# Patient Record
Sex: Female | Born: 1950
Health system: Southern US, Community
[De-identification: ages and names within clinical notes are randomized; demographics above are authoritative.]

## PROBLEM LIST (undated history)

## (undated) DIAGNOSIS — I10 Essential (primary) hypertension: Secondary | ICD-10-CM

## (undated) DIAGNOSIS — M8580 Other specified disorders of bone density and structure, unspecified site: Secondary | ICD-10-CM

## (undated) DIAGNOSIS — B029 Zoster without complications: Secondary | ICD-10-CM

## (undated) HISTORY — PX: REDUCTION MAMMAPLASTY: SUR839

## (undated) HISTORY — DX: Essential (primary) hypertension: I10

## (undated) HISTORY — DX: Zoster without complications: B02.9

## (undated) HISTORY — DX: Other specified disorders of bone density and structure, unspecified site: M85.80

## (undated) HISTORY — PX: TONSILLECTOMY AND ADENOIDECTOMY: SHX28

---

## 1997-09-20 HISTORY — PX: BREAST SURGERY: SHX581

## 2010-07-02 ENCOUNTER — Other Ambulatory Visit: Admission: RE | Admit: 2010-07-02 | Discharge: 2010-07-02 | Payer: Self-pay | Admitting: Family Medicine

## 2010-07-09 ENCOUNTER — Encounter: Admission: RE | Admit: 2010-07-09 | Discharge: 2010-07-09 | Payer: Self-pay | Admitting: Family Medicine

## 2010-07-22 ENCOUNTER — Encounter: Admission: RE | Admit: 2010-07-22 | Discharge: 2010-07-22 | Payer: Self-pay | Admitting: Family Medicine

## 2010-10-10 ENCOUNTER — Encounter: Payer: Self-pay | Admitting: Family Medicine

## 2011-09-17 ENCOUNTER — Encounter: Payer: Self-pay | Admitting: Gynecology

## 2011-09-17 ENCOUNTER — Other Ambulatory Visit (HOSPITAL_COMMUNITY)
Admission: RE | Admit: 2011-09-17 | Discharge: 2011-09-17 | Disposition: A | Discharge status: Home / Self Care | Payer: BC Managed Care – PPO | Source: Ambulatory Visit | Attending: Gynecology | Admitting: Gynecology

## 2011-09-17 ENCOUNTER — Ambulatory Visit (INDEPENDENT_AMBULATORY_CARE_PROVIDER_SITE_OTHER): Payer: BC Managed Care – PPO | Admitting: Gynecology

## 2011-09-17 VITALS — BP 150/90 | Ht 64.25 in | Wt 169.0 lb

## 2011-09-17 DIAGNOSIS — Z01419 Encounter for gynecological examination (general) (routine) without abnormal findings: Secondary | ICD-10-CM | POA: Insufficient documentation

## 2011-09-17 DIAGNOSIS — N8111 Cystocele, midline: Secondary | ICD-10-CM

## 2011-09-17 DIAGNOSIS — N950 Postmenopausal bleeding: Secondary | ICD-10-CM

## 2011-09-17 DIAGNOSIS — N816 Rectocele: Secondary | ICD-10-CM

## 2011-09-17 DIAGNOSIS — N814 Uterovaginal prolapse, unspecified: Secondary | ICD-10-CM

## 2011-09-17 NOTE — Progress Notes (Signed)
Sharon Burton 09/02/1951 [id number]        60 y.o.  New patient for annual exam and complaining of postmenopausal spotting. Patient notes her last 2 days spotting with wiping. She is having no abdominal pain cramping or other symptoms.    Past medical history,surgical history, medications, allergies, family history and social history were all reviewed and documented in the EPIC chart. ROS:  Was performed and pertinent positives and negatives are included in the history.  Exam: chaperone present Filed Vitals:   09/17/11 0905  BP: 150/90   General appearance  Normal Skin grossly normal Head/Neck normal with no cervical or supraclavicular adenopathy thyroid normal Lungs  clear Cardiac RR, without RMG Abdominal  soft, nontender, without masses, organomegaly or hernia Breasts  examined lying and sitting without masses, retractions, discharge or axillary adenopathy.  Well-healed old bilateral reduction scars noted Pelvic  Ext/BUS/vagina  With mild atrophic changes. Second degree cystocele with straining first degree uterine prolapse first to second-degree rectocele.  Cervix  normal  Pap done  Uterus  anteverted, normal size, shape and contour, midline and mobile nontender.  Mild uterine descent with straining.  Adnexa  Without masses or tenderness    Anus and perineum  normal   Rectovaginal  normal sphincter tone without palpated masses or tenderness. First to second degree rectocele noted with exam.  Old external hemorrhoids.   Assessment/Plan:  60 y.o. female for annual exam.    1. Postmenopausal bleeding. Reviewed differential with the patient to include endometrial carcinoma. We'll start with sonohysterogram she will schedule a follow up for this. 2. Cystocele/rectocele/uterine prolapse. Patient does note being told this in the past. If she is straining a lot she does feel something protruding from her vagina. This is not overly bothersome to her. She has no stress urinary incontinence  type symptoms. Options for management include observation, pessary trial, surgery was reviewed and the patient would prefer observation at this time. 3. High blood pressure.  Patient's blood pressure is 150/90. She has no history of hypertension in the past. Recommended she recheck this in a non-exam situation. She does have a blood pressure cuff at home as her husband is being followed for hypertension and she'll recheck it there. If it does continue elevated the patient is to follow up with her primary for further evaluation and possible treatment. 4. Mammogram. She's over a year from her last mammogram. Recommend she schedule this now and she agrees to do so. SBE monthly reviewed.  5. Pap smear. She has no history of abnormal Pap smears in the past although I have no copies from her prior records. I did a Pap smear today. I discussed less frequent screening options and if this returns normal that we will do every 3 year screens noting that over the last 5 years or so historically she has had annual normal Pap smears. 6. Bone health. Recommended increased calcium vitamin D. We'll plan DEXA next year at age 61. 7. Colonoscopy. Patient has never had a colonoscopy and I strongly recommended her to schedule a screening colonoscopy and she agrees to arrange this. 8. Health maintenance. No blood work was done today as it is all done through her primary's office who she actively sees. Patient will follow up for her sonohysterogram and then we'll go from there.     Dara Lords MD, 9:44 AM 09/17/2011

## 2011-09-17 NOTE — Patient Instructions (Signed)
Follow for sonohysterogram as scheduled. Recheck blood pressure. If it remains elevated then you'll need to see her primary physician for further evaluation.  Schedule mammography and colonoscopy.

## 2011-09-29 ENCOUNTER — Other Ambulatory Visit: Payer: Self-pay | Admitting: Gynecology

## 2011-09-29 ENCOUNTER — Ambulatory Visit (INDEPENDENT_AMBULATORY_CARE_PROVIDER_SITE_OTHER): Payer: BC Managed Care – PPO

## 2011-09-29 ENCOUNTER — Ambulatory Visit (INDEPENDENT_AMBULATORY_CARE_PROVIDER_SITE_OTHER): Payer: BC Managed Care – PPO | Admitting: Gynecology

## 2011-09-29 ENCOUNTER — Encounter: Payer: Self-pay | Admitting: Gynecology

## 2011-09-29 DIAGNOSIS — N83339 Acquired atrophy of ovary and fallopian tube, unspecified side: Secondary | ICD-10-CM

## 2011-09-29 DIAGNOSIS — N854 Malposition of uterus: Secondary | ICD-10-CM

## 2011-09-29 DIAGNOSIS — N950 Postmenopausal bleeding: Secondary | ICD-10-CM

## 2011-09-29 NOTE — Patient Instructions (Signed)
Follow up for biopsy results. Report any further vaginal bleeding in the future.

## 2011-09-29 NOTE — Progress Notes (Signed)
Patient presents for sonohysterogram do to postmenopausal spotting.  Ultrasound initially shows endometrial echo at 2.6 mm. No uterine abnormalities noted. Right and left ovaries normal. Negative cul-de-sac. Sonohysterogram was performed, sterile technique, anterior lip tenaculum placement necessary to thread the catheter. There was good distention with no abnormalities seen. An endometrial sample was taken. The patient tolerated well.  Discussed ultrasound results with the patient. If biopsy returns benign or in adequate them will follow expectantly. If bleeding continues will report, if resolves then will follow. If biopsy otherwise then we will triage based on results.

## 2012-10-20 ENCOUNTER — Ambulatory Visit (INDEPENDENT_AMBULATORY_CARE_PROVIDER_SITE_OTHER): Payer: BC Managed Care – PPO | Admitting: Gynecology

## 2012-10-20 ENCOUNTER — Encounter: Payer: Self-pay | Admitting: Gynecology

## 2012-10-20 VITALS — BP 124/80 | Ht 65.0 in | Wt 172.0 lb

## 2012-10-20 DIAGNOSIS — Z780 Asymptomatic menopausal state: Secondary | ICD-10-CM

## 2012-10-20 DIAGNOSIS — Z13220 Encounter for screening for lipoid disorders: Secondary | ICD-10-CM

## 2012-10-20 DIAGNOSIS — Z01419 Encounter for gynecological examination (general) (routine) without abnormal findings: Secondary | ICD-10-CM

## 2012-10-20 DIAGNOSIS — N814 Uterovaginal prolapse, unspecified: Secondary | ICD-10-CM

## 2012-10-20 LAB — CBC WITH DIFFERENTIAL/PLATELET
Basophils Absolute: 0 10*3/uL (ref 0.0–0.1)
Basophils Relative: 0 % (ref 0–1)
Eosinophils Absolute: 0.2 10*3/uL (ref 0.0–0.7)
Eosinophils Relative: 3 % (ref 0–5)
HCT: 38.2 % (ref 36.0–46.0)
Hemoglobin: 12.9 g/dL (ref 12.0–15.0)
Lymphocytes Relative: 30 % (ref 12–46)
Lymphs Abs: 1.4 10*3/uL (ref 0.7–4.0)
MCH: 30.4 pg (ref 26.0–34.0)
MCHC: 33.8 g/dL (ref 30.0–36.0)
MCV: 89.9 fL (ref 78.0–100.0)
Monocytes Absolute: 0.4 10*3/uL (ref 0.1–1.0)
Monocytes Relative: 8 % (ref 3–12)
Neutro Abs: 2.7 10*3/uL (ref 1.7–7.7)
Neutrophils Relative %: 59 % (ref 43–77)
Platelets: 334 10*3/uL (ref 150–400)
RBC: 4.25 MIL/uL (ref 3.87–5.11)
RDW: 13 % (ref 11.5–15.5)
WBC: 4.6 10*3/uL (ref 4.0–10.5)

## 2012-10-20 LAB — LIPID PANEL
Cholesterol: 195 mg/dL (ref 0–200)
HDL: 64 mg/dL (ref >39)
LDL Cholesterol: 112 mg/dL — ABNORMAL HIGH (ref 0–99)
Total CHOL/HDL Ratio: 3 Ratio
Triglycerides: 97 mg/dL (ref <150)
VLDL: 19 mg/dL (ref 0–40)

## 2012-10-20 LAB — COMPREHENSIVE METABOLIC PANEL
ALT: 18 U/L (ref 0–35)
AST: 15 U/L (ref 0–37)
Albumin: 4.4 g/dL (ref 3.5–5.2)
Alkaline Phosphatase: 109 U/L (ref 39–117)
BUN: 14 mg/dL (ref 6–23)
CO2: 26 mEq/L (ref 19–32)
Calcium: 9.4 mg/dL (ref 8.4–10.5)
Chloride: 103 mEq/L (ref 96–112)
Creat: 0.71 mg/dL (ref 0.50–1.10)
Glucose, Bld: 100 mg/dL — ABNORMAL HIGH (ref 70–99)
Potassium: 4.3 mEq/L (ref 3.5–5.3)
Sodium: 143 mEq/L (ref 135–145)
Total Bilirubin: 0.4 mg/dL (ref 0.3–1.2)
Total Protein: 7.1 g/dL (ref 6.0–8.3)

## 2012-10-20 LAB — TSH: TSH: 2.305 u[IU]/mL (ref 0.350–4.500)

## 2012-10-20 NOTE — Patient Instructions (Addendum)
Office will call you to schedule surgery  Schedule your colonoscopy.  [facility]  Call to Schedule your mammogram  Facilities in [place]: 1)  [facility] [place], [place] Phone: [phone] 2)  [facility], [phone] Phone: [phone] 3)  Dr. Yolanda Bonine at [facility] [address] Phone: [phone]     Mammogram A mammogram is an X-ray test to find changes in a woman's breast. You should get a mammogram if:  You are 18 years of age or older  You have risk factors.   Your doctor recommends that you have one.  BEFORE THE TEST  Do not schedule the test the week before your period, especially if your breasts are sore during this time.  On the day of your mammogram:  Wash your breasts and armpits well. After washing, do not put on any deodorant or talcum powder on until after your test.   Eat and drink as you usually do.   Take your medicines as usual.   If you are diabetic and take insulin, make sure you:   Eat before coming for your test.   Take your insulin as usual.   If you cannot keep your appointment, call before the appointment to cancel. Schedule another appointment.  TEST  You will need to undress from the waist up. You will put on a hospital gown.   Your breast will be put on the mammogram machine, and it will press firmly on your breast with a piece of plastic called a compression paddle. This will make your breast flatter so that the machine can X-ray all parts of your breast.   Both breasts will be X-rayed. Each breast will be X-rayed from above and from the side. An X-ray might need to be taken again if the picture is not good enough.   The mammogram will last about 15 to 30 minutes.  AFTER THE TEST Finding out the results of your test Ask when your test results will be ready. Make sure you get your test  results.  Document Released: 12/03/2008 Document Revised: 08/26/2011 Document Reviewed: 12/03/2008 [facility] Patient Information 2012 [place], [place].

## 2012-10-20 NOTE — Progress Notes (Signed)
Sharon Burton 05-Oct-1950 [id number]        62 y.o.  [id number] for annual exam.  Several issues noted below.  Past medical history,surgical history, medications, allergies, family history and social history were all reviewed and documented in the EPIC chart. ROS:  Was performed and pertinent positives and negatives are included in the history.  Exam: Kim assistant Filed Vitals:   10/20/12 0956  BP: 124/80  Height: 5\' 5"  (1.651 m)  Weight: 172 lb (78.019 kg)   General appearance  Normal Skin grossly normal Head/Neck normal with no cervical or supraclavicular adenopathy thyroid normal Lungs  clear Cardiac RR, without RMG Abdominal  soft, nontender, without masses, organomegaly or hernia Breasts  examined lying and sitting without masses, retractions, discharge or axillary adenopathy.  Well healed bilateral reduction scars. Pelvic  Ext/BUS/vagina  normal with atrophic changes.  Cervix  normal with descent to the vaginal opening  Uterus  anteverted, normal size, shape and contour, midline and mobile nontender   Adnexa  Without masses or tenderness    Anus and perineum  normal   Rectovaginal  normal sphincter tone without palpated masses or tenderness.    Assessment/Plan:  62 y.o. [id number] female for annual exam.   1. POP with uterine prolapse. Grade 2. Bladder appears well supported without gross evidence of cystocele. Some rectal vaginal wall weakness with no overt rectocele with straining. Reviewed options with the patient as I did last year to include observation pessary and surgery. Patient is noting more symptoms where every time she is on the toilet and straining the cervix is felt protruding. Also a lot of pressure symptoms. Occasional SUI type symptoms but not overly bothersome. Patient wants to proceed with surgery and I recommend a vaginal hysterectomy. Do not feel anterior/posterior colporrhaphy needed at this time although I did review with her that this may become needed in the  future and the issue as to whether to do now "prophylactically" discussed. Patient is not interested she understands the need for potential future surgery. She also understands the possible need for bladder surgery in the future if incontinence becomes an issue at this point she appears to be well supported and has no overt issues other than the uterine prolapse. The ovarian conservation issue was reviewed with her the options to remove both ovaries at the time of surgery or leave them discussed. At 61 the question is how much more hormonal benefit she this to riding versus the risks of ovarian disease in the future. She has no strong family history of ovarian cancer. After a lengthy discussion she wants both ovaries removed but if this would require a separate incision either laparoscopically or larger she would not want this if the ovaries appeared normal and accepts the risk of ovarian disease in the future such as ovarian cancer.  We will move toward scheduling the surgery and will represent for full preoperative consult. 2. Pap smear 2012. No Pap smear done today. No history of significant abnormalities in the past. Less frequent screening options reviewed. If she does proceed with hysterectomy the issue about stop screening altogether was also discussed. We'll readdress this next year on an annual basis. 3. Mammography 2011. Strongly recommended she schedule mammogram now. The benefits of her early detection were reviewed. SBE monthly discussed. 4. DEXA. Patient's never had a baseline DEXA and I recommend she schedule one now. Increase calcium vitamin D discussed. 5. Colonoscopy. Patient's colonoscopy I strongly recommended she schedule this and she agrees to do so.  The benefits of early detection and removing premalignant polyps were reviewed.  Names of physicians in [place] were given. 6. Health maintenance. Patient has not had routine blood work done in over a year. Baseline CBC lipid profile  comprehensive metabolic panel vitamin D TSH urinalysis ordered.   Dara Lords MD, 10:38 AM 10/20/2012

## 2012-10-21 LAB — URINALYSIS W MICROSCOPIC + REFLEX CULTURE
Bilirubin Urine: NEGATIVE
Casts: NONE SEEN
Crystals: NONE SEEN
Glucose, UA: NEGATIVE mg/dL
Hgb urine dipstick: NEGATIVE
Ketones, ur: NEGATIVE mg/dL
Leukocytes, UA: NEGATIVE
Nitrite: NEGATIVE
Protein, ur: NEGATIVE mg/dL
Specific Gravity, Urine: 1.016 (ref 1.005–1.030)
Urobilinogen, UA: 0.2 mg/dL (ref 0.0–1.0)
pH: 7 (ref 5.0–8.0)

## 2012-10-21 LAB — VITAMIN D 25 HYDROXY (VIT D DEFICIENCY, FRACTURES): Vit D, 25-Hydroxy: 34 ng/mL (ref 30–89)

## 2012-10-22 LAB — URINE CULTURE: Colony Count: 2000

## 2012-10-24 ENCOUNTER — Telehealth: Payer: Self-pay

## 2012-10-24 NOTE — Telephone Encounter (Signed)
I left message for patient to call me to discuss ins benefits and scheduling surgery.

## 2012-10-24 NOTE — Telephone Encounter (Signed)
Patient called. We discussed ins benefits and her surgery pre-payment amount.  She wants to schedule her TVH,BSO for the end of April hopefully the 28th or 29th.  I told her I will hold on to her chart and as soon as I get the April schedule I will call her and let her know if that works out.

## 2012-11-28 ENCOUNTER — Ambulatory Visit (INDEPENDENT_AMBULATORY_CARE_PROVIDER_SITE_OTHER): Payer: BC Managed Care – PPO

## 2012-11-28 DIAGNOSIS — Z13820 Encounter for screening for osteoporosis: Secondary | ICD-10-CM

## 2012-11-28 DIAGNOSIS — Z780 Asymptomatic menopausal state: Secondary | ICD-10-CM

## 2012-11-30 ENCOUNTER — Other Ambulatory Visit: Payer: Self-pay

## 2012-11-30 DIAGNOSIS — Z1231 Encounter for screening mammogram for malignant neoplasm of breast: Secondary | ICD-10-CM

## 2012-12-06 ENCOUNTER — Ambulatory Visit
Admission: RE | Admit: 2012-12-06 | Discharge: 2012-12-06 | Disposition: A | Discharge status: Home / Self Care | Payer: BC Managed Care – PPO | Source: Ambulatory Visit

## 2012-12-06 DIAGNOSIS — Z1231 Encounter for screening mammogram for malignant neoplasm of breast: Secondary | ICD-10-CM

## 2012-12-25 ENCOUNTER — Encounter (HOSPITAL_COMMUNITY): Payer: Self-pay

## 2012-12-25 ENCOUNTER — Encounter: Payer: Self-pay | Admitting: Gynecology

## 2012-12-25 ENCOUNTER — Ambulatory Visit (INDEPENDENT_AMBULATORY_CARE_PROVIDER_SITE_OTHER): Payer: BC Managed Care – PPO | Admitting: Gynecology

## 2012-12-25 DIAGNOSIS — N814 Uterovaginal prolapse, unspecified: Secondary | ICD-10-CM

## 2012-12-25 DIAGNOSIS — N816 Rectocele: Secondary | ICD-10-CM

## 2012-12-25 NOTE — Patient Instructions (Addendum)
Followup for surgery as scheduled. 

## 2012-12-25 NOTE — Progress Notes (Signed)
Sharon Burton 21-Aug-1951 [id number]   Preoperative consult  Chief complaint: Uterine prolapse  History of present illness: 62 y.o. [id number] progressive uterine prolapse with symptoms of pressure and feeling something protruding from the vagina. Has progressively gotten worse over the last several years. Options for management include observation, pessary, surgery discussed and she wished to proceed with surgery and is admitted for [facility] BSO.  Past medical history,surgical history, medications, allergies, family history and social history were all reviewed and documented in the EPIC chart. ROS:  Was performed and pertinent positives and negatives are included in the history of present illness.  Exam:  Kim assistant General: well developed, well nourished female, no acute distress HEENT: normal  Lungs: clear to auscultation without wheezing, rales or rhonchi  Cardiac: regular rate without rubs, murmurs or gallops  Abdomen: soft, nontender without masses, guarding, rebound, organomegaly  Pelvic: external bus vagina: normal  without significant cystocele. First to second degree rectocele on exam. Cervix to the level of the introitus with straining Cervix: grossly normal  Uterus: normal size, midline and mobile, nontender  Adnexa: without masses or tenderness  Rectovaginal exam within normal limits. Confirms mild rectocele.    Assessment/Plan:  62 y.o. [id number] with progressive uterine prolapse. Does not appear to have significant cystocele. Does have mild rectocele. Reviewed with the patient as in the past and I again reviewed today that it appears her main issue is uterine prolapse. The question is to whether we also perform a posterior colporrhaphy or an anterior/posterior colporrhaphy at this time versus waiting and see if she develops issues with cystocele rectocele in the future. We both agreed on vaginal hysterectomy only recognizing that she may need to proceed with anterior/posterior  colporrhaphy in the future and excepting this possibility. We also reviewed the ovarian conservation issue again as in the past. Options of keeping both ovaries or removing them discussed. She does not have a strong family history of ovarian cancer but does want to have both ovaries removed if we are able to accomplish this vaginally. If it would require a separate incision abdominally then she does not want this done and she will keep 1 or both ovaries understanding that she also would then be at risk for ovarian cancer and she understands and accepts this. The possibility although low at age 62 that she would develop significant hypoestrogenic symptoms by removing her ovaries was also discussed. The expected intraoperative postoperative courses as well as the recovery period was reviewed. The risks of infection, prolonged antibiotics, abscess or hematoma development requiring reoperation and drainage were reviewed. The risk of hemorrhage necessitating transfusion and the risk of transfusion discussed to include transfusion reaction hepatitis HIV mad cow disease and other unknown entities. The risk of internal organ damage including bowel bladder ureters vessels and nerves either immediately recognized or delay recognized requiring major reparative surgeries and future reparative surgeries including bowel resection and bladder repair ureteral damage repair ostomy formation was all discussed understood and accepted. The possibility of converting a vaginal approach to a abdominal approach with a larger incision and a longer recovery period was also reviewed. If abdominal incisions are made the risk of incisional complications including opening and draining of incisions, closure by secondary intention, cosmetic/keloid reviewed. Sexuality following hysterectomy was discussed to include the potential for orgasmic dysfunction as well as persistent dyspareunia. Patient's questions were answered to her satisfaction and she  is ready to proceed with surgery.    Dara Lords MD, 9:49 AM 12/25/2012

## 2012-12-25 NOTE — H&P (Signed)
Sharon Burton March 23, 1951 [id number]   History and Physical  Chief complaint: Uterine prolapse  History of present illness: 62 y.o. [id number] progressive uterine prolapse with symptoms of pressure and feeling something protruding from the vagina. Has progressively gotten worse over the last several years. Options for management include observation, pessary, surgery discussed and she wished to proceed with surgery and is admitted for [facility] BSO.  Past medical history,surgical history, medications, allergies, family history and social history were all reviewed and documented in the EPIC chart. ROS:  Was performed and pertinent positives and negatives are included in the history of present illness.  Exam:  Sharon Burton assistant General: well developed, well nourished female, no acute distress HEENT: normal  Lungs: clear to auscultation without wheezing, rales or rhonchi  Cardiac: regular rate without rubs, murmurs or gallops  Abdomen: soft, nontender without masses, guarding, rebound, organomegaly  Pelvic: external bus vagina: normal  without significant cystocele. First to second degree rectocele on exam. Cervix to the level of the introitus with straining Cervix: grossly normal  Uterus: normal size, midline and mobile, nontender  Adnexa: without masses or tenderness  Rectovaginal exam within normal limits. Confirms mild rectocele.    Assessment/Plan:  62 y.o. Sharon Burton with progressive uterine prolapse. Does not appear to have significant cystocele. Does have mild rectocele. Reviewed with the patient as in the past and I again reviewed today that it appears her main issue is uterine prolapse. The question is to whether we also perform a posterior colporrhaphy or an anterior/posterior colporrhaphy at this time versus waiting and see if she develops issues with cystocele rectocele in the future. We both agreed on vaginal hysterectomy only recognizing that she may need to proceed with anterior/posterior  colporrhaphy in the future and excepting this possibility. We also reviewed the ovarian conservation issue again as in the past. Options of keeping both ovaries or removing them discussed. She does not have a strong family history of ovarian cancer but does want to have both ovaries removed if we are able to accomplish this vaginally. If it would require a separate incision abdominally then she does not want this done and she will keep 1 or both ovaries understanding that she also would then be at risk for ovarian cancer and she understands and accepts this. The possibility although low at age 29 that she would develop significant hypoestrogenic symptoms by removing her ovaries was also discussed. The expected intraoperative postoperative courses as well as the recovery period was reviewed. The risks of infection, prolonged antibiotics, abscess or hematoma development requiring reoperation and drainage were reviewed. The risk of hemorrhage necessitating transfusion and the risk of transfusion discussed to include transfusion reaction hepatitis HIV mad cow disease and other unknown entities. The risk of internal organ damage including bowel bladder ureters vessels and nerves either immediately recognized or delay recognized requiring major reparative surgeries and future reparative surgeries including bowel resection and bladder repair ureteral damage repair ostomy formation was all discussed understood and accepted. The possibility of converting a vaginal approach to a abdominal approach with a larger incision and a longer recovery period was also reviewed. If abdominal incisions are made the risk of incisional complications including opening and draining of incisions, closure by secondary intention, cosmetic/keloid reviewed. Sexuality following hysterectomy was discussed to include the potential for orgasmic dysfunction as well as persistent dyspareunia. Patient's questions were answered to her satisfaction and she  is ready to proceed with surgery.    Dara Lords MD, 10:00 AM 12/25/2012

## 2012-12-26 ENCOUNTER — Encounter (HOSPITAL_COMMUNITY)
Admission: RE | Admit: 2012-12-26 | Discharge: 2012-12-26 | Disposition: A | Discharge status: Home / Self Care | Payer: BC Managed Care – PPO | Source: Ambulatory Visit | Attending: Gynecology | Admitting: Gynecology

## 2012-12-26 ENCOUNTER — Telehealth: Payer: Self-pay

## 2012-12-26 ENCOUNTER — Encounter (HOSPITAL_COMMUNITY): Payer: Self-pay

## 2012-12-26 LAB — CBC
HCT: 39 % (ref 36.0–46.0)
Hemoglobin: 12.9 g/dL (ref 12.0–15.0)
MCH: 30.1 pg (ref 26.0–34.0)
MCHC: 33.1 g/dL (ref 30.0–36.0)
MCV: 91.1 fL (ref 78.0–100.0)
Platelets: 281 10*3/uL (ref 150–400)
RBC: 4.28 MIL/uL (ref 3.87–5.11)
RDW: 13 % (ref 11.5–15.5)
WBC: 5.3 10*3/uL (ref 4.0–10.5)

## 2012-12-26 LAB — COMPREHENSIVE METABOLIC PANEL
ALT: 24 U/L (ref 0–35)
Albumin: 3.8 g/dL (ref 3.5–5.2)
Alkaline Phosphatase: 116 U/L (ref 39–117)
BUN: 11 mg/dL (ref 6–23)
Calcium: 9.6 mg/dL (ref 8.4–10.5)
Chloride: 105 mEq/L (ref 96–112)
Creatinine, Ser: 0.71 mg/dL (ref 0.50–1.10)
GFR calc Af Amer: >90 mL/min (ref >90)
GFR calc non Af Amer: >90 mL/min (ref >90)
Glucose, Bld: 112 mg/dL — ABNORMAL HIGH (ref 70–99)
Potassium: 5.2 mEq/L — ABNORMAL HIGH (ref 3.5–5.1)
Sodium: 140 mEq/L (ref 135–145)
Total Bilirubin: 0.4 mg/dL (ref 0.3–1.2)
Total Protein: 7.3 g/dL (ref 6.0–8.3)

## 2012-12-26 LAB — SURGICAL PCR SCREEN
MRSA, PCR: INVALID — AB
Staphylococcus aureus: INVALID — AB

## 2012-12-26 NOTE — Telephone Encounter (Signed)
Patient called. Informed regarding potassium level being a "touch high".  She is not taking any supplemental potassium or any vitamin supp that might have it in it.  She will be careful to avoid high potassium foods between now and surgery date.

## 2012-12-26 NOTE — Pre-Procedure Instructions (Signed)
EKG of today (12/26/12) reviewed and accepted by Dr Arby Barrette. No orders given.

## 2012-12-26 NOTE — Telephone Encounter (Signed)
Message copied by Keenan Bachelor on Tue Dec 26, 2012 10:09 AM ------      Message from: Dara Lords      Created: Tue Dec 26, 2012  9:51 AM       Tell patient that her potassium level was a touch high. If she is taking any supplemental potassium or foods with higher potassium stop this preoperatively. ------

## 2012-12-26 NOTE — Patient Instructions (Addendum)
20 Sharon Burton  12/26/2012   Your procedure is scheduled on:  01/01/13  Enter through the Main Entrance of [facility] at 6 AM.  Pick up the phone at the desk and dial [phone].   Call this number if you have problems the morning of surgery: [phone]   Remember:   Do not eat food:After Midnight.  Do not drink clear liquids: After Midnight.  Take these medicines the morning of surgery with A SIP OF WATER: NA   Do not wear jewelry, make-up or nail polish.  Do not wear lotions, powders, or perfumes. You may wear deodorant.  Do not shave 48 hours prior to surgery.  Do not bring valuables to the hospital.  Contacts, dentures or bridgework may not be worn into surgery.  Leave suitcase in the car. After surgery it may be brought to your room.  For patients admitted to the hospital, checkout time is 11:00 AM the day of discharge.   Patients discharged the day of surgery will not be allowed to drive home.  Name and phone number of your driver: NA  Special Instructions: Shower using CHG 2 nights before surgery and the night before surgery.  If you shower the day of surgery use CHG.  Use special wash - you have one bottle of CHG for all showers.  You should use approximately 1/3 of the bottle for each shower.   Please read over the following fact sheets that you were given: MRSA Information

## 2012-12-26 NOTE — Telephone Encounter (Signed)
Left message to call regarding lab result/Dr. TF recommendation.

## 2012-12-28 LAB — MRSA CULTURE

## 2012-12-31 MED ORDER — DEXTROSE 5 % IV SOLN
2.0000 g | INTRAVENOUS | Status: AC
  Administered 2013-01-01: 2 g via INTRAVENOUS
  Filled 2012-12-31: qty 2

## 2013-01-01 ENCOUNTER — Encounter (HOSPITAL_COMMUNITY)
Admission: RE | Disposition: A | Discharge status: Home / Self Care | Payer: Self-pay | Source: Ambulatory Visit | Attending: Gynecology

## 2013-01-01 ENCOUNTER — Ambulatory Visit (HOSPITAL_COMMUNITY)
Admission: RE | Admit: 2013-01-01 | Discharge: 2013-01-02 | Disposition: A | Discharge status: Home / Self Care | Payer: BC Managed Care – PPO | Source: Ambulatory Visit | Attending: Gynecology | Admitting: Gynecology

## 2013-01-01 ENCOUNTER — Encounter (HOSPITAL_COMMUNITY): Payer: Self-pay | Admitting: Anesthesiology

## 2013-01-01 ENCOUNTER — Encounter (HOSPITAL_COMMUNITY): Payer: Self-pay

## 2013-01-01 ENCOUNTER — Ambulatory Visit (HOSPITAL_COMMUNITY): Payer: BC Managed Care – PPO | Admitting: Anesthesiology

## 2013-01-01 DIAGNOSIS — Z9889 Other specified postprocedural states: Secondary | ICD-10-CM

## 2013-01-01 DIAGNOSIS — N816 Rectocele: Secondary | ICD-10-CM

## 2013-01-01 DIAGNOSIS — N812 Incomplete uterovaginal prolapse: Secondary | ICD-10-CM | POA: Insufficient documentation

## 2013-01-01 DIAGNOSIS — N880 Leukoplakia of cervix uteri: Secondary | ICD-10-CM | POA: Insufficient documentation

## 2013-01-01 DIAGNOSIS — N814 Uterovaginal prolapse, unspecified: Secondary | ICD-10-CM

## 2013-01-01 DIAGNOSIS — N838 Other noninflammatory disorders of ovary, fallopian tube and broad ligament: Secondary | ICD-10-CM | POA: Insufficient documentation

## 2013-01-01 HISTORY — PX: VAGINAL HYSTERECTOMY: SHX2639

## 2013-01-01 HISTORY — PX: CYSTOCELE REPAIR: SHX163

## 2013-01-01 SURGERY — HYSTERECTOMY, VAGINAL
Anesthesia: General | Wound class: Clean Contaminated

## 2013-01-01 MED ORDER — ONDANSETRON HCL 4 MG/2ML IJ SOLN
INTRAMUSCULAR | Status: AC
  Filled 2013-01-01: qty 2

## 2013-01-01 MED ORDER — ONDANSETRON HCL 4 MG/2ML IJ SOLN
INTRAMUSCULAR | Status: DC | PRN
  Administered 2013-01-01: 4 mg via INTRAVENOUS

## 2013-01-01 MED ORDER — FENTANYL CITRATE 0.05 MG/ML IJ SOLN
INTRAMUSCULAR | Status: AC
  Filled 2013-01-01: qty 5

## 2013-01-01 MED ORDER — NEOSTIGMINE METHYLSULFATE 1 MG/ML IJ SOLN
INTRAMUSCULAR | Status: AC
  Filled 2013-01-01: qty 1

## 2013-01-01 MED ORDER — DEXTROSE-NACL 5-0.9 % IV SOLN: INTRAVENOUS | Status: DC

## 2013-01-01 MED ORDER — LIDOCAINE HCL (CARDIAC) 20 MG/ML IV SOLN
INTRAVENOUS | Status: AC
  Filled 2013-01-01: qty 5

## 2013-01-01 MED ORDER — FENTANYL CITRATE 0.05 MG/ML IJ SOLN
INTRAMUSCULAR | Status: DC | PRN
  Administered 2013-01-01: 100 ug via INTRAVENOUS
  Administered 2013-01-01 (×2): 50 ug via INTRAVENOUS

## 2013-01-01 MED ORDER — LIDOCAINE HCL (CARDIAC) 20 MG/ML IV SOLN
INTRAVENOUS | Status: DC | PRN
  Administered 2013-01-01 (×2): 50 mg via INTRAVENOUS

## 2013-01-01 MED ORDER — KETOROLAC TROMETHAMINE 30 MG/ML IJ SOLN: 30.0000 mg | Freq: Four times a day (QID) | INTRAMUSCULAR | Status: DC

## 2013-01-01 MED ORDER — ONDANSETRON HCL 4 MG/2ML IJ SOLN: 4.0000 mg | Freq: Four times a day (QID) | INTRAMUSCULAR | Status: DC | PRN

## 2013-01-01 MED ORDER — ESTRADIOL 0.1 MG/GM VA CREA
TOPICAL_CREAM | VAGINAL | Status: DC | PRN
  Administered 2013-01-01: 1 via VAGINAL

## 2013-01-01 MED ORDER — KETOROLAC TROMETHAMINE 30 MG/ML IJ SOLN
INTRAMUSCULAR | Status: AC
  Administered 2013-01-01: 30 mg via INTRAVENOUS
  Filled 2013-01-01: qty 1

## 2013-01-01 MED ORDER — 0.9 % SODIUM CHLORIDE (POUR BTL) OPTIME
TOPICAL | Status: DC | PRN
  Administered 2013-01-01: 1000 mL

## 2013-01-01 MED ORDER — KETOROLAC TROMETHAMINE 30 MG/ML IJ SOLN
30.0000 mg | Freq: Four times a day (QID) | INTRAMUSCULAR | Status: DC
  Administered 2013-01-01 – 2013-01-02 (×3): 30 mg via INTRAVENOUS
  Filled 2013-01-01 (×3): qty 1

## 2013-01-01 MED ORDER — GLYCOPYRROLATE 0.2 MG/ML IJ SOLN
INTRAMUSCULAR | Status: AC
  Filled 2013-01-01: qty 2

## 2013-01-01 MED ORDER — SCOPOLAMINE 1 MG/3DAYS TD PT72: 1.0000 | MEDICATED_PATCH | Freq: Once | TRANSDERMAL | Status: DC

## 2013-01-01 MED ORDER — ROCURONIUM BROMIDE 100 MG/10ML IV SOLN
INTRAVENOUS | Status: DC | PRN
  Administered 2013-01-01 (×2): 5 mg via INTRAVENOUS
  Administered 2013-01-01: 30 mg via INTRAVENOUS

## 2013-01-01 MED ORDER — PROPOFOL 10 MG/ML IV EMUL
INTRAVENOUS | Status: AC
  Filled 2013-01-01: qty 20

## 2013-01-01 MED ORDER — DIPHENHYDRAMINE HCL 50 MG PO CAPS
50.0000 mg | ORAL_CAPSULE | Freq: Four times a day (QID) | ORAL | Status: DC | PRN
  Filled 2013-01-01: qty 1

## 2013-01-01 MED ORDER — ESTRADIOL 0.1 MG/GM VA CREA
TOPICAL_CREAM | VAGINAL | Status: AC
  Filled 2013-01-01: qty 42.5

## 2013-01-01 MED ORDER — LIDOCAINE-EPINEPHRINE 1 %-1:100000 IJ SOLN
INTRAMUSCULAR | Status: DC | PRN
  Administered 2013-01-01: 20 mL

## 2013-01-01 MED ORDER — LACTATED RINGERS IV SOLN
INTRAVENOUS | Status: DC
  Administered 2013-01-01: 50 mL/h via INTRAVENOUS
  Administered 2013-01-01 (×2): via INTRAVENOUS

## 2013-01-01 MED ORDER — PROPOFOL 10 MG/ML IV EMUL
INTRAVENOUS | Status: DC | PRN
  Administered 2013-01-01: 30 mg via INTRAVENOUS
  Administered 2013-01-01: 150 mg via INTRAVENOUS

## 2013-01-01 MED ORDER — FENTANYL CITRATE 0.05 MG/ML IJ SOLN: 25.0000 ug | INTRAMUSCULAR | Status: DC | PRN

## 2013-01-01 MED ORDER — GLYCOPYRROLATE 0.2 MG/ML IJ SOLN
INTRAMUSCULAR | Status: DC | PRN
  Administered 2013-01-01: 0.2 mg via INTRAVENOUS
  Administered 2013-01-01: 0.6 mg via INTRAVENOUS
  Administered 2013-01-01: 0.2 mg via INTRAVENOUS

## 2013-01-01 MED ORDER — ONDANSETRON HCL 4 MG PO TABS: 4.0000 mg | ORAL_TABLET | Freq: Four times a day (QID) | ORAL | Status: DC | PRN

## 2013-01-01 MED ORDER — OXYCODONE-ACETAMINOPHEN 5-325 MG PO TABS
1.0000 | ORAL_TABLET | ORAL | Status: DC | PRN
  Administered 2013-01-01 – 2013-01-02 (×2): 1 via ORAL
  Filled 2013-01-01 (×2): qty 1

## 2013-01-01 MED ORDER — NEOSTIGMINE METHYLSULFATE 1 MG/ML IJ SOLN
INTRAMUSCULAR | Status: DC | PRN
  Administered 2013-01-01: 3 mg via INTRAVENOUS

## 2013-01-01 MED ORDER — SCOPOLAMINE 1 MG/3DAYS TD PT72
MEDICATED_PATCH | TRANSDERMAL | Status: AC
  Administered 2013-01-01: 1.5 mg via TRANSDERMAL
  Filled 2013-01-01: qty 1

## 2013-01-01 MED ORDER — DEXTROSE-NACL 5-0.9 % IV SOLN
INTRAVENOUS | Status: DC
  Administered 2013-01-01 – 2013-01-02 (×2): via INTRAVENOUS

## 2013-01-01 MED ORDER — DEXAMETHASONE SODIUM PHOSPHATE 10 MG/ML IJ SOLN
INTRAMUSCULAR | Status: AC
  Filled 2013-01-01: qty 1

## 2013-01-01 MED ORDER — SILVER SULFADIAZINE 1 % EX CREA
TOPICAL_CREAM | CUTANEOUS | Status: AC
  Filled 2013-01-01: qty 50

## 2013-01-01 MED ORDER — MIDAZOLAM HCL 5 MG/5ML IJ SOLN
INTRAMUSCULAR | Status: DC | PRN
  Administered 2013-01-01: 2 mg via INTRAVENOUS

## 2013-01-01 MED ORDER — MIDAZOLAM HCL 2 MG/2ML IJ SOLN
INTRAMUSCULAR | Status: AC
  Filled 2013-01-01: qty 2

## 2013-01-01 MED ORDER — DEXAMETHASONE SODIUM PHOSPHATE 4 MG/ML IJ SOLN
INTRAMUSCULAR | Status: DC | PRN
  Administered 2013-01-01: 10 mg via INTRAVENOUS

## 2013-01-01 MED ORDER — MORPHINE SULFATE 4 MG/ML IJ SOLN
1.0000 mg | INTRAMUSCULAR | Status: DC | PRN
  Administered 2013-01-01: 2 mg via INTRAVENOUS
  Filled 2013-01-01: qty 1

## 2013-01-01 SURGICAL SUPPLY — 29 items
CANISTER SUCTION 2500CC (MISCELLANEOUS) ×3 IMPLANT
CLOTH BEACON ORANGE TIMEOUT ST (SAFETY) ×3 IMPLANT
CONT PATH 16OZ SNAP LID 3702 (MISCELLANEOUS) IMPLANT
CONTAINER PREFILL 10% NBF 60ML (FORM) IMPLANT
DECANTER SPIKE VIAL GLASS SM (MISCELLANEOUS) IMPLANT
DRESSING TELFA 8X3 (GAUZE/BANDAGES/DRESSINGS) ×3 IMPLANT
GAUZE PACKING 1 X5 YD ST (GAUZE/BANDAGES/DRESSINGS) ×1 IMPLANT
GLOVE BIO SURGEON STRL SZ7.5 (GLOVE) ×3 IMPLANT
GLOVE BIOGEL PI IND STRL 6.5 (GLOVE) ×2 IMPLANT
GLOVE BIOGEL PI INDICATOR 6.5 (GLOVE) ×1
GOWN STRL REIN XL XLG (GOWN DISPOSABLE) ×14 IMPLANT
NDL SPNL 18GX3.5 QUINCKE PK (NEEDLE) ×2 IMPLANT
NDL SPNL 22GX3.5 QUINCKE BK (NEEDLE) IMPLANT
NEEDLE HYPO 22GX1.5 SAFETY (NEEDLE) IMPLANT
NEEDLE MAYO .5 CIRCLE (NEEDLE) IMPLANT
NEEDLE SPNL 18GX3.5 QUINCKE PK (NEEDLE) ×3 IMPLANT
NEEDLE SPNL 22GX3.5 QUINCKE BK (NEEDLE) IMPLANT
NS IRRIG 1000ML POUR BTL (IV SOLUTION) ×3 IMPLANT
PACK VAGINAL WOMENS (CUSTOM PROCEDURE TRAY) ×3 IMPLANT
PAD OB MATERNITY 4.3X12.25 (PERSONAL CARE ITEMS) ×3 IMPLANT
SUT VIC AB 0 CT1 18XCR BRD8 (SUTURE) ×6 IMPLANT
SUT VIC AB 0 CT1 36 (SUTURE) ×3 IMPLANT
SUT VIC AB 0 CT1 8-18 (SUTURE) ×9
SUT VIC AB 2-0 SH 27 (SUTURE) ×6
SUT VIC AB 2-0 SH 27XBRD (SUTURE) IMPLANT
SUT VICRYL 0 TIES 12 18 (SUTURE) ×3 IMPLANT
TOWEL OR 17X24 6PK STRL BLUE (TOWEL DISPOSABLE) ×6 IMPLANT
TRAY FOLEY CATH 14FR (SET/KITS/TRAYS/PACK) ×3 IMPLANT
WATER STERILE IRR 1000ML POUR (IV SOLUTION) ×3 IMPLANT

## 2013-01-01 NOTE — Progress Notes (Signed)
Sharon Burton [birth year] [id number]   Day of Surgery s/p Procedure(s): HYSTERECTOMY VAGINAL, BILATERAL SALPINGO-OOPHORECTOMY ANTERIOR REPAIR (CYSTOCELE)  Subjective: Patient reports feels well, no acute distress, pain severity reported mild, yes taking PO, foley catheter in place, no ambulating, nopassing flatus  Objective: Vital signs in last 24 hours: Temp:  [97.4 F (36.3 C)-98.2 F (36.8 C)] 97.4 F (36.3 C) (04/14 1115) Pulse Rate:  [53-71] 53 (04/14 1115) Resp:  [12-20] 16 (04/14 1115) BP: (106-143)/(50-81) 121/76 mmHg (04/14 1115) SpO2:  [95 %-100 %] 98 % (04/14 1115) Weight:  [180 lb (81.647 kg)] 180 lb (81.647 kg) (04/14 1115) Last BM Date: 12/31/12  EXAM General: awake, alert and no distress Resp: rhonchi clear to auscultation bilaterally Cardio: regular rate and rhythm, S1, S2 normal, no murmur, click, rub or gallop GI: soft, minimal tender, bowel sounds active Lower Extremities: Without swelling or tenderness   Lab Results:  No results found for this basename: WBC, HGB, HCT, PLT,  in the last 72 hours  Assessment: s/p Procedure(s): HYSTERECTOMY VAGINAL, BILATERAL SALPINGO-OOPHORECTOMY ANTERIOR REPAIR (CYSTOCELE): stable  Plan: Continue routine post operative care.  Results of surgery reviewed to include anterior repair.  Expected recovery course reviewed with tentative discharge in the AM.  LOS: 0 days    Dara Lords, MD 01/01/2013 1:52 PM

## 2013-01-01 NOTE — Anesthesia Preprocedure Evaluation (Addendum)
Anesthesia Evaluation  Patient identified by MRN, date of birth, ID band Patient awake    Reviewed: Allergy & Precautions, H&P , NPO status , Patient's Chart, lab work & pertinent test results  Airway Mallampati: I TM Distance: >3 FB Neck ROM: Full    Dental  (+) Dental Advisory Given   Pulmonary former smoker,  breath sounds clear to auscultation        Cardiovascular negative cardio ROS  Rhythm:Regular Rate:Normal     Neuro/Psych negative neurological ROS  negative psych ROS   GI/Hepatic negative GI ROS, Neg liver ROS,   Endo/Other  negative endocrine ROS  Renal/GU negative Renal ROS     Musculoskeletal negative musculoskeletal ROS (+)   Abdominal   Peds  Hematology negative hematology ROS (+)   Anesthesia Other Findings   Reproductive/Obstetrics                          Anesthesia Physical Anesthesia Plan  ASA: II  Anesthesia Plan: General   Post-op Pain Management:    Induction: Intravenous  Airway Management Planned: Oral ETT  Additional Equipment:   Intra-op Plan:   Post-operative Plan: Extubation in OR  Informed Consent: I have reviewed the patients History and Physical, chart, labs and discussed the procedure including the risks, benefits and alternatives for the proposed anesthesia with the patient or authorized representative who has indicated his/her understanding and acceptance.   Dental advisory given  Plan Discussed with: CRNA  Anesthesia Plan Comments:         Anesthesia Quick Evaluation

## 2013-01-01 NOTE — H&P (Signed)
  The patient was examined.  I reviewed the proposed surgery and consent form with the patient.  The dictated history and physical is current and accurate and all questions were answered. The patient is ready to proceed with surgery and has a realistic understanding and expectation for the outcome.   Dara Lords MD, 7:10 AM 01/01/2013

## 2013-01-01 NOTE — Transfer of Care (Signed)
Immediate Anesthesia Transfer of Care Note  Patient: Sharon Burton  Procedure(s) Performed: Procedure(s): HYSTERECTOMY VAGINAL, BILATERAL SALPINGO-OOPHORECTOMY (Bilateral) ANTERIOR REPAIR (CYSTOCELE) (N/A)  Patient Location: PACU  Anesthesia Type:General  Level of Consciousness: awake and oriented  Airway & Oxygen Therapy: Patient Spontanous Breathing and Patient connected to nasal cannula oxygen  Post-op Assessment: Report given to PACU RN and Post -op Vital signs reviewed and stable  Post vital signs: Reviewed and stable  Complications: No apparent anesthesia complications

## 2013-01-01 NOTE — Anesthesia Postprocedure Evaluation (Signed)
Anesthesia Post Note  Patient: Sharon Burton  Procedure(s) Performed: Procedure(s): HYSTERECTOMY VAGINAL, BILATERAL SALPINGO-OOPHORECTOMY (Bilateral) ANTERIOR REPAIR (CYSTOCELE) (N/A)  Anesthesia type: General  Patient location: [facility]  Post pain: Pain level controlled  Post assessment: Post-op Vital signs reviewed  Last Vitals: BP 121/76  Pulse 53  Temp(Src) 36.3 C (Oral)  Resp 16  Ht 5\' 5"  (1.651 m)  Wt 180 lb (81.647 kg)  BMI 29.95 kg/m2  SpO2 98%  LMP 09/17/2003  Post vital signs: Reviewed  Level of consciousness: awake  Complications: No apparent anesthesia complications

## 2013-01-01 NOTE — Op Note (Signed)
Sharon Burton 04-Jul-1951 [id number]   Post Operative Note   Date of surgery:  01/01/2013  Pre Op Dx:  Uterine prolapse, cystocele, rectocele  Post Op Dx:  same  Procedure:  Total vaginal hysterectomy, bilateral salpingo-oophorectomy, anterior colporrhaphy.  Surgeon:  Dara Lords  Assistant:  Reynaldo Minium  Anesthesia:  General  EBL:  50 cc  Complications:  None  Specimen:  Uterus, right and left ovaries, right and left fallopian tubes to pathology  Findings: EUA:  External BUS vagina with uterine prolapse. Otherwise mild atrophic changes. Cervix grossly normal. Uterus normal size midline mobile. Adnexa without masses   Operative:  Uterus grossly normal. Right and left ovaries grossly normal. Right and left fallopian tubes grossly normal. Grade 2 cystocele after removal of the uterus to the level of the introitus.  Cul-de-sac/pelvis grossly normal to limited inspection.  Procedure:  The patient was taken to the operating room, underwent general anesthesia, was placed in the dorsal lithotomy position, received a perineal/vaginal preparation with Betadine solution and a Foley catheterization was placed in sterile technique by the nursing personnel. An EUA was performed and the patient was draped in the usual fashion. A time out was performed by the surgical team. The cervix was visualized with a weighted speculum and grasped with a tenaculum and the cervical mucosa was circumferentially injected using 1% lidocaine with 1:100,000 epinephrine mixture, a total of 10 cc.  The cervical mucosa was then circumferentially incised and the paracervical planes were then sharply developed. The posterior cul-de-sac was then sharply entered without difficulty and a long weighted speculum was placed. The right uterosacral ligament was identified, clamped cut and ligated using 0 Vicryl suture and tagged for future reference. A similar procedure was carried out on the other side. The anterior  vesicouterine plane was sharply and bluntly developed and the anterior cul-de-sac was subsequently entered without difficulty. The uterus was then progressively freed from its attachments through clamping cutting and ligating of the paracervical and parametrial tissues using 0 Vicryl suture. The uterus was then delivered through the vagina and the uterine-ovarian pedicles were clamped bilaterally and the specimen excised. A tagged tail sponge was then placed to pack the intestines from the cul-de-sac and the right ovary and fallopian tube segment were identified and the infundibulopelvic ligament and vessels were clamped cut and doubly ligated using 0 Vicryl suture. A similar procedure was carried out on the other side and both of the adnexal specimens were sent to pathology. The long weighted speculum was replaced with a shorter weighted speculum and the posterior vaginal cuff was run from uterosacral ligament to uterosacral ligament using 0 Vicryl suture in a running interlocking stitch.  The bowel packing was removed and at this point it became evident that the patient had a significant cystocele as there still was protrusion of the anterior vaginal wall to the level of the introitus and it was decided to proceed with an anterior colporrhaphy, this possibility having been discussed with the patient preoperatively. It was felt that if we did not proceed with this repair she would be significantly symptomatic postoperatively. The anterior vaginal cuff was grasped with Allis clamps and through sharp and blunt dissection the anterior vaginal mucosa was progressively sharply incised in the midline staying well below the urethral opening and addressing only where the cystocele was evident.  Using sharp and blunt dissection the vesico-vaginal plane was developed laterally and subsequently the cystocele was reduced through progressive imbrication of the tissues using 2-0 Vicryl suture in interrupted  stitch. The excess  vaginal mucosa was excised and the anterior vaginal mucosa was reapproximated using 2-0 Vicryl in a running interlocking stitch incorporating the underlying tissues to close the dead space. The vagina was then irrigated and subsequently closed anterior to posterior using 0 Vicryl suture in interrupted figure-of-eight stitch. The vagina again was irrigated, adequate hemostasis visualized, the weighted speculum removed the patient was placed in the supine position having received intraoperative Toradol with clear yellow urine in the Foley catheter. The patient was awakened without difficulty and taken to recovery in good condition having tolerated the procedure well.    Dara Lords MD, 9:09 AM 01/01/2013

## 2013-01-01 NOTE — Anesthesia Postprocedure Evaluation (Signed)
  Anesthesia Post-op Note  Patient: Sharon Burton  Procedure(s) Performed: Procedure(s): HYSTERECTOMY VAGINAL, BILATERAL SALPINGO-OOPHORECTOMY (Bilateral) ANTERIOR REPAIR (CYSTOCELE) (N/A)  Patient is awake and responsive. Pain and nausea are reasonably well controlled. Vital signs are stable and clinically acceptable. Oxygen saturation is clinically acceptable. There are no apparent anesthetic complications at this time. Patient is ready for discharge.

## 2013-01-02 ENCOUNTER — Encounter (HOSPITAL_COMMUNITY): Payer: Self-pay | Admitting: Gynecology

## 2013-01-02 MED ORDER — OXYCODONE-ACETAMINOPHEN 5-325 MG PO TABS: 1.0000 | ORAL_TABLET | ORAL | Status: DC | PRN

## 2013-01-02 NOTE — Progress Notes (Signed)
Patient ID: Sharon Burton, female   DOB: [birth year], 62 y.o.   MRN: [id number] Sharon Burton [birth year] [id number]   1 Day Post-Op Procedure(s) (LRB): HYSTERECTOMY VAGINAL, BILATERAL SALPINGO-OOPHORECTOMY (Bilateral) ANTERIOR REPAIR (CYSTOCELE) (N/A)  Subjective: Patient reports has no problems, feels well, pain severity reported mild, yes taking PO, foley catheter in place, yes ambulating, yes passing flatus  Objective: Vital signs in last 24 hours: Temp:  [97.4 F (36.3 C)-98.2 F (36.8 C)] 97.8 F (36.6 C) (04/15 0129) Pulse Rate:  [53-73] 67 (04/15 0129) Resp:  [12-20] 18 (04/15 0557) BP: (92-142)/(47-76) 100/53 mmHg (04/15 0557) SpO2:  [93 %-100 %] 94 % (04/15 0557) Weight:  [180 lb (81.647 kg)] 180 lb (81.647 kg) (04/14 1115) Last BM Date: 12/31/12    EXAM General: awake, alert and no distress Resp: rhonchi clear to auscultation bilaterally Cardio: regular rate and rhythm, S1, S2 normal, no murmur, click, rub or gallop GI: soft, non tender, bowel sounds active Lower Extremities: Without swelling or tenderness Vaginal packing removed without bleeding   Assessment: s/p Procedure(s): HYSTERECTOMY VAGINAL, BILATERAL SALPINGO-OOPHORECTOMY ANTERIOR REPAIR (CYSTOCELE): progressing well, vaginal packing removed, ready for discharge.  Ambulating, eating, minimal pain  Plan: Discharge home today after voiding.  Precautions, instructions and follow up were discussed with the patient.  Prescriptions provided per AVS.  Patient to call the office to arrange a post-operative appointmant in 2 weeks.  Dara Lords, MD 01/02/2013 8:10 AM

## 2013-01-02 NOTE — Discharge Summary (Signed)
  Sharon Burton [birth year] [id number]   Discharge Summary  Date of Admission:  01/01/2013  Date of Discharge:  01/02/2013  Discharge Diagnosis:  Uterine prolapse, cystocele, rectocele  Procedure:  Procedure(s): HYSTERECTOMY VAGINAL, BILATERAL SALPINGO-OOPHORECTOMY ANTERIOR REPAIR (CYSTOCELE)  Pathology: MRN # : [id number] Diagnosis Uterus, ovaries and fallopian tubes - CERVIX:: HYPERKERATOSIS CONSISTENT WITH PROLAPSE. - ENDOMETRIUM: PROLIFERATIVE. NO EVIDENCE OF HYPERPLASIA OR CARCINOMA. - MYOMETRIUM AND UTERINE SEROSA: UNREMARKABLE. - RIGHT AND LEFT FALLOPIAN TUBES: BENIGN PARATUBAL CYSTS. NO ENDOMETRIOSIS OR EVIDENCE OF MALIGNANCY. - RIGHT AND LEFT OVARIES: UNREMARKABLE. NO ENDOMETRIOSIS OR EVIDENCE OF MALIGNANCY.  Hospital Course:  Patient underwent an uncomplicated TVH, BSO, anterior colporrhaphy 01/01/2013. The patient's postoperative course was uncomplicated and she was discharged on postoperative day #1 ambulating well, tolerating a regular diet, voiding without difficulty with good relief of her pain. The patient received instructions for postoperative care and call precautions.  She received prescriptions per AVS and will be seen in the office 2 weeks following discharge.       Dara Lords MD, 4:37 PM 01/02/2013

## 2013-01-02 NOTE — Progress Notes (Signed)
Pt is discharged in the care of husband. Downstairs per ambulatory. Stable. Denies any pain or discomfort. Spirits are good.. Denies heavy vaginal bleeding. Voiding without problems Discharge instruction given with good understanding. Questions were asked and answered. Stable.

## 2013-01-08 ENCOUNTER — Institutional Professional Consult (permissible substitution): Payer: BC Managed Care – PPO | Admitting: Gynecology

## 2013-01-16 ENCOUNTER — Ambulatory Visit: Payer: BC Managed Care – PPO | Admitting: Gynecology

## 2013-01-16 ENCOUNTER — Encounter: Payer: Self-pay | Admitting: Gynecology

## 2013-01-16 ENCOUNTER — Ambulatory Visit (INDEPENDENT_AMBULATORY_CARE_PROVIDER_SITE_OTHER): Payer: BC Managed Care – PPO | Admitting: Gynecology

## 2013-01-16 DIAGNOSIS — Z9889 Other specified postprocedural states: Secondary | ICD-10-CM

## 2013-01-16 NOTE — Patient Instructions (Signed)
Followup in 2 weeks for your next postoperative visit. Continue with nothing in the vagina. Avoid strenuous activities that would require bearing down.

## 2013-01-16 NOTE — Progress Notes (Signed)
Patient presents at 2 week postop visit from [facility] BSO anterior colporrhaphy. She's been well without complaints. Occasional staining.  Exam with Kim assistant Abdomen soft nontender without masses guarding rebound organomegaly Pelvic external BUS vagina with healing cuff and anterior suture line. Bimanual without masses or tenderness.  Assessment and plan: Normal postoperative check status post TVH BSO anterior colporrhaphy. Doing well voiding without difficulty or symptoms. Continue to advance postoperative activities with the exception of continued pelvic rest and no significant strenuous activities. Reviewed pathology with her which was all benign. Followup in 2 weeks for her next postoperative visit.

## 2013-01-30 ENCOUNTER — Encounter: Payer: Self-pay | Admitting: Gynecology

## 2013-01-30 ENCOUNTER — Ambulatory Visit (INDEPENDENT_AMBULATORY_CARE_PROVIDER_SITE_OTHER): Payer: BC Managed Care – PPO | Admitting: Gynecology

## 2013-01-30 DIAGNOSIS — Z9889 Other specified postprocedural states: Secondary | ICD-10-CM

## 2013-01-30 NOTE — Progress Notes (Signed)
4 week postop visit status post TVH BSO anterior repair. Doing well. Did have 2 separate episodes of loss of urine while sleeping. No other symptoms such as frequency dysuria or urgency. No loss of urine otherwise throughout the day spontaneously or with coughing laughing sneezing.  Exam which can assistant External BUS vagina with anterior suture line and cuff healing nicely. Bimanual without masses or tenderness  Assessment and plan: Normal postoperative visit. Check urinalysis rule out subclinical UTI as source of loss of urine. Suspect some detrusor instability as source of her urine loss while sleeping as no other symptoms throughout the day otherwise. Followup in 2 weeks for next postoperative visit. Does a lot of heavy lifting at work and will recommend 8 week recovery period to allow complete healing of the anterior repair.

## 2013-01-30 NOTE — Patient Instructions (Signed)
Followup in 2 weeks for a postop visit.

## 2013-01-31 LAB — URINALYSIS W MICROSCOPIC + REFLEX CULTURE
Casts: NONE SEEN
Crystals: NONE SEEN
Glucose, UA: NEGATIVE mg/dL
Nitrite: NEGATIVE
Specific Gravity, Urine: 1.007 (ref 1.005–1.030)
pH: 7 (ref 5.0–8.0)

## 2013-02-02 ENCOUNTER — Other Ambulatory Visit: Payer: Self-pay | Admitting: Gynecology

## 2013-02-02 MED ORDER — NITROFURANTOIN MONOHYD MACRO 100 MG PO CAPS: 100.0000 mg | ORAL_CAPSULE | Freq: Two times a day (BID) | ORAL | Status: DC

## 2013-02-03 LAB — URINE CULTURE

## 2013-02-14 ENCOUNTER — Encounter: Payer: Self-pay | Admitting: Gynecology

## 2013-02-14 ENCOUNTER — Ambulatory Visit (INDEPENDENT_AMBULATORY_CARE_PROVIDER_SITE_OTHER): Payer: BC Managed Care – PPO | Admitting: Gynecology

## 2013-02-14 DIAGNOSIS — Z9889 Other specified postprocedural states: Secondary | ICD-10-CM

## 2013-02-14 NOTE — Progress Notes (Signed)
Patient presents postop status post TVH BSO anterior repair. Doing well does note occasional issue I symptoms with coughing sneezing. No other UTI symptoms.  Exam with Kim Asst. Abdomen soft nontender without masses guarding rebound organomegaly. Pelvic external BUS vagina normal with incision lines healed well and cuff healed. Bimanual without masses or tenderness.  Assessment and plan: 6 weeks status post TVH BSO anterior repair. Doing well. Has noticed occasional SUI type symptoms with coughing sneezing. I reviewed with her the possibility of paradoxical continence with cystocele/uterine prolapse but did not feel that she had that degree of prolapse to warrant this. Suspect this is a postoperative healing issue and will resolve over time. I did review that if it does persist she may need to consider sling and urologic referral. Patient will continue with slowly resuming normal activities. Avoid heavy lifting straining for the next month and may resume intercourse in another one to 2 weeks at a 7-8 week postop time frame.

## 2013-02-14 NOTE — Patient Instructions (Signed)
Slowly resume normal activities. Followup January 2015 for annual exam.

## 2013-02-15 LAB — URINALYSIS W MICROSCOPIC + REFLEX CULTURE
Bilirubin Urine: NEGATIVE
Casts: NONE SEEN
Crystals: NONE SEEN
Specific Gravity, Urine: 1.007 (ref 1.005–1.030)
Squamous Epithelial / LPF: NONE SEEN
Urobilinogen, UA: 0.2 mg/dL (ref 0.0–1.0)
pH: 7 (ref 5.0–8.0)

## 2013-02-19 ENCOUNTER — Other Ambulatory Visit: Payer: Self-pay | Admitting: Gynecology

## 2013-02-19 MED ORDER — AMPICILLIN 500 MG PO CAPS: 500.0000 mg | ORAL_CAPSULE | Freq: Four times a day (QID) | ORAL | Status: DC

## 2013-06-06 ENCOUNTER — Other Ambulatory Visit: Payer: Self-pay | Admitting: Family Medicine

## 2013-06-06 DIAGNOSIS — R202 Paresthesia of skin: Secondary | ICD-10-CM

## 2013-06-14 ENCOUNTER — Ambulatory Visit
Admission: RE | Admit: 2013-06-14 | Discharge: 2013-06-14 | Disposition: A | Discharge status: Home / Self Care | Payer: BC Managed Care – PPO | Source: Ambulatory Visit | Attending: Family Medicine | Admitting: Family Medicine

## 2013-06-14 DIAGNOSIS — R202 Paresthesia of skin: Secondary | ICD-10-CM

## 2014-07-19 ENCOUNTER — Encounter: Payer: BC Managed Care – PPO | Admitting: Gynecology

## 2014-07-22 ENCOUNTER — Encounter: Payer: Self-pay | Admitting: Gynecology

## 2014-08-01 ENCOUNTER — Encounter: Payer: BC Managed Care – PPO | Admitting: Gynecology

## 2014-10-02 ENCOUNTER — Other Ambulatory Visit (HOSPITAL_COMMUNITY)
Admission: RE | Admit: 2014-10-02 | Discharge: 2014-10-02 | Disposition: A | Discharge status: Home / Self Care | Payer: BC Managed Care – PPO | Source: Ambulatory Visit | Attending: Gynecology | Admitting: Gynecology

## 2014-10-02 ENCOUNTER — Ambulatory Visit (INDEPENDENT_AMBULATORY_CARE_PROVIDER_SITE_OTHER): Payer: BLUE CROSS/BLUE SHIELD | Admitting: Gynecology

## 2014-10-02 ENCOUNTER — Encounter: Payer: Self-pay | Admitting: Gynecology

## 2014-10-02 VITALS — BP 122/78 | Ht 64.0 in | Wt 182.0 lb

## 2014-10-02 DIAGNOSIS — Z01419 Encounter for gynecological examination (general) (routine) without abnormal findings: Secondary | ICD-10-CM

## 2014-10-02 DIAGNOSIS — N952 Postmenopausal atrophic vaginitis: Secondary | ICD-10-CM

## 2014-10-02 NOTE — Patient Instructions (Signed)
Schedule colonoscopy with [facility] at [phone] or [facility] at [phone]  Call to Schedule your mammogram  Facilities in [place]: 1)  [facility], [address] Phone: [phone] 2)  [facility]. [facility], [address] Phone: [phone] 3)  Dr. Bertrand at [facility]  [address] Phone: [phone]     Mammogram A mammogram is an X-ray test to find changes in a woman's breast. You should get a mammogram if:  You are 40 years of age or older  You have risk factors.   Your doctor recommends that you have one.  BEFORE THE TEST  Do not schedule the test the week before your period, especially if your breasts are sore during this time.  On the day of your mammogram:  Wash your breasts and armpits well. After washing, do not put on any deodorant or talcum powder on until after your test.   Eat and drink as you usually do.   Take your medicines as usual.   If you are diabetic and take insulin, make sure you:   Eat before coming for your test.   Take your insulin as usual.   If you cannot keep your appointment, call before the appointment to cancel. Schedule another appointment.  TEST  You will need to undress from the waist up. You will put on a hospital gown.   Your breast will be put on the mammogram machine, and it will press firmly on your breast with a piece of plastic called a compression paddle. This will make your breast flatter so that the machine can X-ray all parts of your breast.   Both breasts will be X-rayed. Each breast will be X-rayed from above and from the side. An X-ray might need to be taken again if the picture is not good enough.   The mammogram will last about 15 to 30 minutes.  AFTER THE TEST Finding out the results of your test Ask when your test results will be ready. Make sure you get your test  results.  Document Released: 12/03/2008 Document Revised: 08/26/2011 Document Reviewed: 12/03/2008 Exit[employer] Patient Information 2012 [employer].  You may obtain a copy of any labs that were done today by logging onto MyChart as outlined in the instructions provided with your AVS (after visit summary). The office will not call with normal lab results but certainly if there are any significant abnormalities then we will contact you.   Health Maintenance, Female A healthy lifestyle and preventative care can promote health and wellness.  Maintain regular health, dental, and eye exams.  Eat a healthy diet. Foods like vegetables, fruits, whole grains, low-fat dairy products, and lean protein foods contain the nutrients you need without too many calories. Decrease your intake of foods high in solid fats, added sugars, and salt. Get information about a proper diet from your caregiver, if necessary.  Regular physical exercise is one of the most important things you can do for your health. Most adults should get at least 150 minutes of moderate-intensity exercise (any activity that increases your heart rate and causes you to sweat) each week. In addition, most adults need muscle-strengthening exercises on 2 or more days a week.   Maintain a healthy weight. The body mass index (BMI) is a screening tool to identify possible weight problems. It provides an estimate of body fat based on height and weight. Your caregiver can help determine your   BMI, and can help you achieve or maintain a healthy weight. For adults 20 years and older:  A BMI below 18.5 is considered underweight.  A BMI of 18.5 to 24.9 is normal.  A BMI of 25 to 29.9 is considered overweight.  A BMI of 30 and above is considered obese.  Maintain normal blood lipids and cholesterol by exercising and minimizing your intake of saturated fat. Eat a balanced diet with plenty of fruits and vegetables. Blood tests for lipids and cholesterol  should begin at age 35 and be repeated every 5 years. If your lipid or cholesterol levels are high, you are over 50, or you are a high risk for heart disease, you may need your cholesterol levels checked more frequently.Ongoing high lipid and cholesterol levels should be treated with medicines if diet and exercise are not effective.  If you smoke, find out from your caregiver how to quit. If you do not use tobacco, do not start.  Lung cancer screening is recommended for adults aged 44 80 years who are at high risk for developing lung cancer because of a history of smoking. Yearly low-dose computed tomography (CT) is recommended for people who have at least a 30-pack-year history of smoking and are a current smoker or have quit within the past 15 years. A pack year of smoking is smoking an average of 1 pack of cigarettes a day for 1 year (for example: 1 pack a day for 30 years or 2 packs a day for 15 years). Yearly screening should continue until the smoker has stopped smoking for at least 15 years. Yearly screening should also be stopped for people who develop a health problem that would prevent them from having lung cancer treatment.  If you are pregnant, do not drink alcohol. If you are breastfeeding, be very cautious about drinking alcohol. If you are not pregnant and choose to drink alcohol, do not exceed 1 drink per day. One drink is considered to be 12 ounces (355 mL) of beer, 5 ounces (148 mL) of wine, or 1.5 ounces (44 mL) of liquor.  Avoid use of street drugs. Do not share needles with anyone. Ask for help if you need support or instructions about stopping the use of drugs.  High blood pressure causes heart disease and increases the risk of stroke. Blood pressure should be checked at least every 1 to 2 years. Ongoing high blood pressure should be treated with medicines, if weight loss and exercise are not effective.  If you are 22 to 63 years old, ask your caregiver if you should take aspirin  to prevent strokes.  Diabetes screening involves taking a blood sample to check your fasting blood sugar level. This should be done once every 3 years, after age 35, if you are within normal weight and without risk factors for diabetes. Testing should be considered at a younger age or be carried out more frequently if you are overweight and have at least 1 risk factor for diabetes.  Breast cancer screening is essential preventative care for women. You should practice "breast self-awareness." This means understanding the normal appearance and feel of your breasts and may include breast self-examination. Any changes detected, no matter how small, should be reported to a caregiver. Women in their 15s and 30s should have a clinical breast exam (CBE) by a caregiver as part of a regular health exam every 1 to 3 years. After age 63, women should have a CBE every year. Starting at age 7, women  should consider having a mammogram (breast X-ray) every year. Women who have a family history of breast cancer should talk to their caregiver about genetic screening. Women at a high risk of breast cancer should talk to their caregiver about having an MRI and a mammogram every year.  Breast cancer gene (BRCA)-related cancer risk assessment is recommended for women who have family members with BRCA-related cancers. BRCA-related cancers include breast, ovarian, tubal, and peritoneal cancers. Having family members with these cancers may be associated with an increased risk for harmful changes (mutations) in the breast cancer genes BRCA1 and BRCA2. Results of the assessment will determine the need for genetic counseling and BRCA1 and BRCA2 testing.  The Pap test is a screening test for cervical cancer. Women should have a Pap test starting at age 14. Between ages 39 and 42, Pap tests should be repeated every 2 years. Beginning at age 57, you should have a Pap test every 3 years as long as the past 3 Pap tests have been normal. If  you had a hysterectomy for a problem that was not cancer or a condition that could lead to cancer, then you no longer need Pap tests. If you are between ages 35 and 4, and you have had normal Pap tests going back 10 years, you no longer need Pap tests. If you have had past treatment for cervical cancer or a condition that could lead to cancer, you need Pap tests and screening for cancer for at least 20 years after your treatment. If Pap tests have been discontinued, risk factors (such as a new sexual partner) need to be reassessed to determine if screening should be resumed. Some women have medical problems that increase the chance of getting cervical cancer. In these cases, your caregiver may recommend more frequent screening and Pap tests.  The human papillomavirus (HPV) test is an additional test that may be used for cervical cancer screening. The HPV test looks for the virus that can cause the cell changes on the cervix. The cells collected during the Pap test can be tested for HPV. The HPV test could be used to screen women aged 3 years and older, and should be used in women of any age who have unclear Pap test results. After the age of 66, women should have HPV testing at the same frequency as a Pap test.  Colorectal cancer can be detected and often prevented. Most routine colorectal cancer screening begins at the age of 61 and continues through age 96. However, your caregiver may recommend screening at an earlier age if you have risk factors for colon cancer. On a yearly basis, your caregiver may provide home test kits to check for hidden blood in the stool. Use of a small camera at the end of a tube, to directly examine the colon (sigmoidoscopy or colonoscopy), can detect the earliest forms of colorectal cancer. Talk to your caregiver about this at age 57, when routine screening begins. Direct examination of the colon should be repeated every 5 to 10 years through age 54, unless early forms of  pre-cancerous polyps or small growths are found.  Hepatitis C blood testing is recommended for all people born from [birth year] through 1964 and any individual with known risks for hepatitis C.  Practice safe sex. Use condoms and avoid high-risk sexual practices to reduce the spread of sexually transmitted infections (STIs). Sexually active women aged 69 and younger should be checked for Chlamydia, which is a common sexually transmitted infection. Older women  with new or multiple partners should also be tested for Chlamydia. Testing for other STIs is recommended if you are sexually active and at increased risk.  Osteoporosis is a disease in which the bones lose minerals and strength with aging. This can result in serious bone fractures. The risk of osteoporosis can be identified using a bone density scan. Women ages 52 and over and women at risk for fractures or osteoporosis should discuss screening with their caregivers. Ask your caregiver whether you should be taking a calcium supplement or vitamin D to reduce the rate of osteoporosis.  Menopause can be associated with physical symptoms and risks. Hormone replacement therapy is available to decrease symptoms and risks. You should talk to your caregiver about whether hormone replacement therapy is right for you.  Use sunscreen. Apply sunscreen liberally and repeatedly throughout the day. You should seek shade when your shadow is shorter than you. Protect yourself by wearing long sleeves, pants, a wide-brimmed hat, and sunglasses year round, whenever you are outdoors.  Notify your caregiver of new moles or changes in moles, especially if there is a change in shape or color. Also notify your caregiver if a mole is larger than the size of a pencil eraser.  Stay current with your immunizations. Document Released: 03/22/2011 Document Revised: 01/01/2013 Document Reviewed: 03/22/2011 [facility] Patient Information 2014 [place].

## 2014-10-02 NOTE — Addendum Note (Signed)
Addended by: Nelva Nay on: 10/02/2014 04:44 PM   Modules accepted: Orders, SmartSet

## 2014-10-02 NOTE — Progress Notes (Signed)
KHILEE HENDRICKSEN 04-15-1951 [id number]        64 y.o.  G2P2002 for annual exam.  Several issues noted below.  Past medical history,surgical history, problem list, medications, allergies, family history and social history were all reviewed and documented as reviewed in the EPIC chart.  ROS:  Performed with pertinent positives and negatives included in the history, assessment and plan.   Additional significant findings :  none   Exam: Kim Counsellor Vitals:   10/02/14 1554  BP: 122/78  Height: 5\' 4"  (1.626 m)  Weight: 182 lb (82.555 kg)   General appearance:  Normal affect, orientation and appearance. Skin: Grossly normal HEENT: Without gross lesions.  No cervical or supraclavicular adenopathy. Thyroid normal.  Lungs:  Clear without wheezing, rales or rhonchi Cardiac: RR, without RMG Abdominal:  Soft, nontender, without masses, guarding, rebound, organomegaly or hernia Breasts:  Examined lying and sitting without masses, retractions, discharge or axillary adenopathy.  With bilateral reduction scars Pelvic:  Ext/BUS/vagina with atrophic changes.  Pap smear of cuff done   Adnexa  Without masses or tenderness    Anus and perineum  Normal   Rectovaginal  Normal sphincter tone without palpated masses or tenderness.    Assessment/Plan:  64 y.o. G38P2002 female for annual exam.   1. Postmenopausal/atrophic genital changes.  Status post TVH BSO anterior colporrhaphy 2014 for prolapse and cystocele.  Without significant hot flushes, night sweats, vaginal dryness or dyspareunia. 2. Pap smear 2012. Pap smear of vaginal cuff today. No history of significant abnormal Pap smears. Options to stop screening altogether she is status post hysterectomy for benign indications versus less frequent screening intervals. Will readdress on an annual basis. 3. Mammography 2014. Patient knows she is overdue. She agrees to schedule. SBE monthly reviewed. 4. Colonoscopy never. I strongly recommended patient  schedule colonoscopy. Benefits of precancerous polyp removal and early detection reviewed. Patient agrees to schedule. Names and numbers given. 5. DEXA 2014 normal. Repeat at age 56. Increased calcium vitamin D reviewed. 6. Health maintenance. No routine blood work done as she reports this done at her primary physician's office. Follow up in one year, sooner as needed.     Anastasio Auerbach MD, 4:24 PM 10/02/2014

## 2014-10-03 LAB — URINALYSIS W MICROSCOPIC + REFLEX CULTURE
Bilirubin Urine: NEGATIVE
CASTS: NONE SEEN
Crystals: NONE SEEN
Glucose, UA: NEGATIVE mg/dL
HGB URINE DIPSTICK: NEGATIVE
Ketones, ur: NEGATIVE mg/dL
Leukocytes, UA: NEGATIVE
NITRITE: NEGATIVE
PROTEIN: NEGATIVE mg/dL
SPECIFIC GRAVITY, URINE: 1.019 (ref 1.005–1.030)
Urobilinogen, UA: 1 mg/dL (ref 0.0–1.0)
pH: 7.5 (ref 5.0–8.0)

## 2014-10-04 LAB — CYTOLOGY - PAP

## 2015-01-03 ENCOUNTER — Telehealth: Payer: Self-pay | Admitting: *Deleted

## 2015-01-03 MED ORDER — ALPRAZOLAM 0.5 MG PO TABS: 0.5000 mg | ORAL_TABLET | Freq: Four times a day (QID) | ORAL | Status: DC | PRN

## 2015-01-03 NOTE — Telephone Encounter (Signed)
Pt has ran out of her Rx for Xanax 0.5 mg that was prescribed by Dr.MacPhail, asked if you would be willing to refill for her with OV since annual was in Jan 2016? Pt rarely takes medication just PRN.  Please advise

## 2015-01-03 NOTE — Telephone Encounter (Signed)
I'm okay with Xanax 0.5 mg #30 one by mouth every 6 hours when necessary anxiety/sleep. Refill 1

## 2015-01-03 NOTE — Telephone Encounter (Signed)
Rx called in, left on pt cell Rx sent

## 2015-07-25 ENCOUNTER — Other Ambulatory Visit: Payer: Self-pay

## 2015-07-25 MED ORDER — ALPRAZOLAM 0.5 MG PO TABS: 0.5000 mg | ORAL_TABLET | Freq: Four times a day (QID) | ORAL | Status: DC | PRN

## 2015-07-25 NOTE — Telephone Encounter (Signed)
Called into pharmacy

## 2016-01-08 ENCOUNTER — Other Ambulatory Visit: Payer: Self-pay

## 2016-01-12 ENCOUNTER — Other Ambulatory Visit: Payer: Self-pay

## 2016-04-22 ENCOUNTER — Telehealth: Payer: Self-pay | Admitting: *Deleted

## 2016-04-22 MED ORDER — ALPRAZOLAM 0.5 MG PO TABS: 0.5000 mg | ORAL_TABLET | Freq: Four times a day (QID) | ORAL | 2 refills | Status: DC | PRN

## 2016-04-22 NOTE — Telephone Encounter (Signed)
Okay for Xanax 0.5 mg #30 with 2 refills

## 2016-04-22 NOTE — Telephone Encounter (Signed)
Rx called in, left on pt voicemail this has been done.

## 2016-04-22 NOTE — Telephone Encounter (Signed)
Pt called requesting refill on xanax 0.5 mg last filled in Nov 2016, takes PRN. Please advise

## 2017-05-06 ENCOUNTER — Encounter: Payer: Self-pay | Admitting: Gynecology

## 2017-05-06 ENCOUNTER — Ambulatory Visit (INDEPENDENT_AMBULATORY_CARE_PROVIDER_SITE_OTHER): Payer: BLUE CROSS/BLUE SHIELD | Admitting: Gynecology

## 2017-05-06 VITALS — BP 124/78 | Ht 64.5 in | Wt 184.0 lb

## 2017-05-06 DIAGNOSIS — Z01411 Encounter for gynecological examination (general) (routine) with abnormal findings: Secondary | ICD-10-CM | POA: Diagnosis not present

## 2017-05-06 DIAGNOSIS — F5101 Primary insomnia: Secondary | ICD-10-CM

## 2017-05-06 DIAGNOSIS — N952 Postmenopausal atrophic vaginitis: Secondary | ICD-10-CM

## 2017-05-06 MED ORDER — ALPRAZOLAM 0.5 MG PO TABS: 0.5000 mg | ORAL_TABLET | Freq: Every evening | ORAL | 1 refills | Status: DC | PRN

## 2017-05-06 NOTE — Progress Notes (Signed)
    KRYSTIANNA SOTH [birth year] [id number]        66 y.o.  G2P2002 for annual exam.    Past medical history,surgical history, problem list, medications, allergies, family history and social history were all reviewed and documented as reviewed in the EPIC chart.  ROS:  Performed with pertinent positives and negatives included in the history, assessment and plan.   Additional significant findings :  None   Exam: Caryn Bee assistant Vitals:   05/06/17 1548  BP: 124/78  Weight: 184 lb (83.5 kg)  Height: 5' 4.5" (1.638 m)   Body mass index is 31.1 kg/m.  General appearance:  Normal affect, orientation and appearance. Skin: Grossly normal HEENT: Without gross lesions.  No cervical or supraclavicular adenopathy. Thyroid normal.  Lungs:  Clear without wheezing, rales or rhonchi Cardiac: RR, without RMG Abdominal:  Soft, nontender, without masses, guarding, rebound, organomegaly or hernia Breasts:  Examined lying and sitting without masses, retractions, discharge or axillary adenopathy. Pelvic:  Ext, BUS, Vagina: With atrophic changes  Adnexa: Without masses or tenderness    Anus and perineum: Normal   Rectovaginal: Normal sphincter tone without palpated masses or tenderness.    Assessment/Plan:  66 y.o. G33P2002 female for annual exam.   1. Postmenopausal/atrophic genital changes. Status post TVH BSO anterior colporrhaphy 2014. Doing well. No significant hot flushes, night sweats or vaginal dryness. 2. Pap smear 2016. No Pap smear done today. No history of abnormal Pap smears. Options to stop screening altogether based on current screening guidelines and hysterectomy history reviewed. Will readdress on an annual basis. 3. Mammography 2014. I strongly recommended she schedule a mammogram now. Most common cancer in women reviewed. Early detection benefits discussed. Patient promises to schedule. Breast exam normal today. Names and numbers provided. 4. Colonoscopy 2017. Repeat at their  recommended interval. 5. DEXA 2014 normal. Recommended DEXA this coming fall issue turns 66 and she agrees to schedule. 6. Insomnia. Uses Xanax occasionally for insomnia and at times when she feels anxious. Has done this for years with good results. Xanax 0.5 mg #30 with one refill provided. 7. Health maintenance. No routine lab work done as patient does this elsewhere. Follow up in one year, sooner as needed.   Anastasio Auerbach MD, 4:09 PM 05/06/2017

## 2017-05-06 NOTE — Patient Instructions (Signed)
Call to Schedule your mammogram  Facilities in [place]: 1)  [facility]. [facility], [address] Phone: [phone] 2)  Dr. Bertrand at [facility]  [address] Phone: [phone]     Mammogram A mammogram is an X-ray test to find changes in a woman's breast. You should get a mammogram if:  You are 65 years of age or older  You have risk factors.   Your doctor recommends that you have one.  BEFORE THE TEST  Do not schedule the test the week before your period, especially if your breasts are sore during this time.  On the day of your mammogram:  Wash your breasts and armpits well. After washing, do not put on any deodorant or talcum powder on until after your test.   Eat and drink as you usually do.   Take your medicines as usual.   If you are diabetic and take insulin, make sure you:   Eat before coming for your test.   Take your insulin as usual.   If you cannot keep your appointment, call before the appointment to cancel. Schedule another appointment.  TEST  You will need to undress from the waist up. You will put on a hospital gown.   Your breast will be put on the mammogram machine, and it will press firmly on your breast with a piece of plastic called a compression paddle. This will make your breast flatter so that the machine can X-ray all parts of your breast.   Both breasts will be X-rayed. Each breast will be X-rayed from above and from the side. An X-ray might need to be taken again if the picture is not good enough.   The mammogram will last about 15 to 30 minutes.  AFTER THE TEST Finding out the results of your test Ask when your test results will be ready. Make sure you get your test results.  Document Released: 12/03/2008 Document Revised: 08/26/2011 Document Reviewed: 12/03/2008 Exit[employer] Patient Information 2012 [employer].   

## 2017-05-24 ENCOUNTER — Other Ambulatory Visit: Payer: Self-pay | Admitting: Gynecology

## 2017-05-24 DIAGNOSIS — Z1231 Encounter for screening mammogram for malignant neoplasm of breast: Secondary | ICD-10-CM

## 2017-05-25 ENCOUNTER — Ambulatory Visit
Admission: RE | Admit: 2017-05-25 | Discharge: 2017-05-25 | Disposition: A | Discharge status: Home / Self Care | Payer: BLUE CROSS/BLUE SHIELD | Source: Ambulatory Visit | Attending: Gynecology | Admitting: Gynecology

## 2017-05-25 DIAGNOSIS — Z1231 Encounter for screening mammogram for malignant neoplasm of breast: Secondary | ICD-10-CM

## 2017-11-14 DIAGNOSIS — Z13220 Encounter for screening for lipoid disorders: Secondary | ICD-10-CM | POA: Diagnosis not present

## 2017-11-14 DIAGNOSIS — R739 Hyperglycemia, unspecified: Secondary | ICD-10-CM | POA: Diagnosis not present

## 2017-11-14 DIAGNOSIS — Z0000 Encounter for general adult medical examination without abnormal findings: Secondary | ICD-10-CM | POA: Diagnosis not present

## 2017-11-14 DIAGNOSIS — T753XXA Motion sickness, initial encounter: Secondary | ICD-10-CM | POA: Diagnosis not present

## 2017-11-14 DIAGNOSIS — Z780 Asymptomatic menopausal state: Secondary | ICD-10-CM | POA: Diagnosis not present

## 2017-11-14 DIAGNOSIS — I10 Essential (primary) hypertension: Secondary | ICD-10-CM | POA: Diagnosis not present

## 2017-11-14 DIAGNOSIS — Z23 Encounter for immunization: Secondary | ICD-10-CM | POA: Diagnosis not present

## 2017-11-14 DIAGNOSIS — H5213 Myopia, bilateral: Secondary | ICD-10-CM | POA: Diagnosis not present

## 2018-02-13 DIAGNOSIS — B029 Zoster without complications: Secondary | ICD-10-CM | POA: Diagnosis not present

## 2018-02-23 DIAGNOSIS — B029 Zoster without complications: Secondary | ICD-10-CM | POA: Diagnosis not present

## 2018-03-02 DIAGNOSIS — B029 Zoster without complications: Secondary | ICD-10-CM | POA: Diagnosis not present

## 2018-03-03 ENCOUNTER — Other Ambulatory Visit: Payer: Self-pay

## 2018-03-03 MED ORDER — ALPRAZOLAM 0.5 MG PO TABS: 0.5000 mg | ORAL_TABLET | Freq: Every evening | ORAL | 1 refills | Status: DC | PRN

## 2018-04-18 ENCOUNTER — Other Ambulatory Visit: Payer: Self-pay | Admitting: Gynecology

## 2018-04-18 DIAGNOSIS — Z1231 Encounter for screening mammogram for malignant neoplasm of breast: Secondary | ICD-10-CM

## 2018-05-18 ENCOUNTER — Ambulatory Visit: Payer: PPO | Admitting: Gynecology

## 2018-05-18 ENCOUNTER — Encounter: Payer: Self-pay | Admitting: Gynecology

## 2018-05-18 VITALS — BP 122/74 | Ht 64.5 in | Wt 178.0 lb

## 2018-05-18 DIAGNOSIS — Z01419 Encounter for gynecological examination (general) (routine) without abnormal findings: Secondary | ICD-10-CM

## 2018-05-18 DIAGNOSIS — Z9189 Other specified personal risk factors, not elsewhere classified: Secondary | ICD-10-CM

## 2018-05-18 DIAGNOSIS — N952 Postmenopausal atrophic vaginitis: Secondary | ICD-10-CM

## 2018-05-18 NOTE — Progress Notes (Signed)
    Sharon Burton 19-Feb-1951 [id number]        66 y.o.  [id number] for breast and pelvic exam.  Without gynecologic complaints.  Past medical history,surgical history, problem list, medications, allergies, family history and social history were all reviewed and documented as reviewed in the EPIC chart.  ROS:  Performed with pertinent positives and negatives included in the history, assessment and plan.   Additional significant findings : None   Exam: Caryn Bee assistant Vitals:   05/18/18 0951  BP: 122/74  Weight: 178 lb (80.7 kg)  Height: 5' 4.5" (1.638 m)   Body mass index is 30.08 kg/m.  General appearance:  Normal affect, orientation and appearance. Skin: Grossly normal HEENT: Without gross lesions.  No cervical or supraclavicular adenopathy. Thyroid normal.  Lungs:  Clear without wheezing, rales or rhonchi Cardiac: RR, without RMG Abdominal:  Soft, nontender, without masses, guarding, rebound, organomegaly or hernia Breasts:  Examined lying and sitting without masses, retractions, discharge or axillary adenopathy. Pelvic:  Ext, BUS, Vagina: With atrophic changes  Adnexa: Without masses or tenderness    Anus and perineum: Normal   Rectovaginal: Normal sphincter tone without palpated masses or tenderness.    Assessment/Plan:  67 y.o. [id number] female for breast and pelvic exam, status post TVH BSO anterior colporrhaphy 2014.  1. Postmenopausal.  Without significant menopausal symptoms. 2. Pap smear 2016.  No Pap smear done today.  No history of significant abnormal Pap smears.  We discussed current screening guidelines and we both agree to stop screening based on age and hysterectomy history. 3. Mammography 2018.  Has scheduled next month.  Breast exam normal today. 4. DEXA 2014 normal.  Recommend follow-up DEXA now and she agrees to schedule. 5. Colonoscopy 2017.  Repeat at their recommended interval. 6. Insomnia uses Xanax 0.5 mg as needed for sleep.  Does well with this  without side effects.  Has supply but will call when needs more. 7. Health maintenance.  No routine lab work done as patient does this elsewhere.  Follow-up 1 year, sooner as needed.   Anastasio Auerbach MD, 10:30 AM 05/18/2018

## 2018-05-18 NOTE — Patient Instructions (Signed)
Follow-up for the bone density as scheduled.  Follow-up for annual exam in 1 year. 

## 2018-05-21 DIAGNOSIS — M8580 Other specified disorders of bone density and structure, unspecified site: Secondary | ICD-10-CM

## 2018-05-21 HISTORY — DX: Other specified disorders of bone density and structure, unspecified site: M85.80

## 2018-05-26 ENCOUNTER — Ambulatory Visit
Admission: RE | Admit: 2018-05-26 | Discharge: 2018-05-26 | Disposition: A | Discharge status: Home / Self Care | Payer: PPO | Source: Ambulatory Visit | Attending: Gynecology | Admitting: Gynecology

## 2018-05-26 DIAGNOSIS — Z1231 Encounter for screening mammogram for malignant neoplasm of breast: Secondary | ICD-10-CM

## 2018-05-30 ENCOUNTER — Ambulatory Visit (INDEPENDENT_AMBULATORY_CARE_PROVIDER_SITE_OTHER): Payer: PPO

## 2018-05-30 ENCOUNTER — Other Ambulatory Visit: Payer: Self-pay | Admitting: Gynecology

## 2018-05-30 DIAGNOSIS — Z780 Asymptomatic menopausal state: Secondary | ICD-10-CM

## 2018-05-30 DIAGNOSIS — M8589 Other specified disorders of bone density and structure, multiple sites: Secondary | ICD-10-CM

## 2018-05-30 DIAGNOSIS — M8588 Other specified disorders of bone density and structure, other site: Secondary | ICD-10-CM | POA: Diagnosis not present

## 2018-05-30 DIAGNOSIS — M85852 Other specified disorders of bone density and structure, left thigh: Secondary | ICD-10-CM

## 2018-05-30 DIAGNOSIS — Z01419 Encounter for gynecological examination (general) (routine) without abnormal findings: Secondary | ICD-10-CM

## 2018-05-31 ENCOUNTER — Encounter: Payer: Self-pay | Admitting: Gynecology

## 2018-05-31 ENCOUNTER — Other Ambulatory Visit: Payer: Self-pay | Admitting: Gynecology

## 2018-05-31 DIAGNOSIS — Z780 Asymptomatic menopausal state: Secondary | ICD-10-CM

## 2018-05-31 DIAGNOSIS — M85852 Other specified disorders of bone density and structure, left thigh: Secondary | ICD-10-CM

## 2018-08-10 DIAGNOSIS — M19071 Primary osteoarthritis, right ankle and foot: Secondary | ICD-10-CM | POA: Diagnosis not present

## 2018-08-10 DIAGNOSIS — M19072 Primary osteoarthritis, left ankle and foot: Secondary | ICD-10-CM | POA: Diagnosis not present

## 2018-08-25 ENCOUNTER — Other Ambulatory Visit: Payer: Self-pay | Admitting: Gynecology

## 2018-08-31 ENCOUNTER — Other Ambulatory Visit: Payer: Self-pay | Admitting: Gynecology

## 2018-11-20 DIAGNOSIS — H5213 Myopia, bilateral: Secondary | ICD-10-CM | POA: Diagnosis not present

## 2018-11-20 DIAGNOSIS — I10 Essential (primary) hypertension: Secondary | ICD-10-CM | POA: Diagnosis not present

## 2018-11-20 DIAGNOSIS — R739 Hyperglycemia, unspecified: Secondary | ICD-10-CM | POA: Diagnosis not present

## 2018-11-20 DIAGNOSIS — E7800 Pure hypercholesterolemia, unspecified: Secondary | ICD-10-CM | POA: Diagnosis not present

## 2018-11-20 DIAGNOSIS — Z23 Encounter for immunization: Secondary | ICD-10-CM | POA: Diagnosis not present

## 2018-11-20 DIAGNOSIS — Z780 Asymptomatic menopausal state: Secondary | ICD-10-CM | POA: Diagnosis not present

## 2018-11-20 DIAGNOSIS — Z0000 Encounter for general adult medical examination without abnormal findings: Secondary | ICD-10-CM | POA: Diagnosis not present

## 2019-03-21 DIAGNOSIS — M67911 Unspecified disorder of synovium and tendon, right shoulder: Secondary | ICD-10-CM | POA: Diagnosis not present

## 2019-03-28 DIAGNOSIS — M7541 Impingement syndrome of right shoulder: Secondary | ICD-10-CM | POA: Diagnosis not present

## 2019-03-28 DIAGNOSIS — M6281 Muscle weakness (generalized): Secondary | ICD-10-CM | POA: Diagnosis not present

## 2019-05-01 ENCOUNTER — Other Ambulatory Visit: Payer: Self-pay | Admitting: Gynecology

## 2019-05-01 DIAGNOSIS — Z1231 Encounter for screening mammogram for malignant neoplasm of breast: Secondary | ICD-10-CM

## 2019-06-20 ENCOUNTER — Other Ambulatory Visit: Payer: Self-pay

## 2019-06-20 ENCOUNTER — Ambulatory Visit
Admission: RE | Admit: 2019-06-20 | Discharge: 2019-06-20 | Disposition: A | Discharge status: Home / Self Care | Payer: PPO | Source: Ambulatory Visit | Attending: Gynecology | Admitting: Gynecology

## 2019-06-20 DIAGNOSIS — Z1231 Encounter for screening mammogram for malignant neoplasm of breast: Secondary | ICD-10-CM

## 2019-06-27 ENCOUNTER — Encounter: Payer: Self-pay | Admitting: Gynecology

## 2019-07-17 ENCOUNTER — Other Ambulatory Visit: Payer: Self-pay | Admitting: Gynecology

## 2019-07-17 NOTE — Telephone Encounter (Signed)
I staff messaged Butch Penny and ask her to contact patient and remind her that she is overdue for CE and see if she can schedule for her.

## 2019-07-17 NOTE — Telephone Encounter (Signed)
Past due for CE. 05/18/2018 was last CE.

## 2019-08-27 ENCOUNTER — Other Ambulatory Visit: Payer: Self-pay

## 2019-08-28 ENCOUNTER — Ambulatory Visit (INDEPENDENT_AMBULATORY_CARE_PROVIDER_SITE_OTHER): Payer: PPO | Admitting: Gynecology

## 2019-08-28 ENCOUNTER — Encounter: Payer: Self-pay | Admitting: Gynecology

## 2019-08-28 VITALS — BP 126/80 | Ht 64.0 in | Wt 178.0 lb

## 2019-08-28 DIAGNOSIS — Z01419 Encounter for gynecological examination (general) (routine) without abnormal findings: Secondary | ICD-10-CM

## 2019-08-28 DIAGNOSIS — N952 Postmenopausal atrophic vaginitis: Secondary | ICD-10-CM

## 2019-08-28 DIAGNOSIS — M8580 Other specified disorders of bone density and structure, unspecified site: Secondary | ICD-10-CM

## 2019-08-28 DIAGNOSIS — Z9189 Other specified personal risk factors, not elsewhere classified: Secondary | ICD-10-CM | POA: Diagnosis not present

## 2019-08-28 DIAGNOSIS — G4709 Other insomnia: Secondary | ICD-10-CM

## 2019-08-28 MED ORDER — ALPRAZOLAM 0.5 MG PO TABS: ORAL_TABLET | ORAL | 5 refills | Status: DC

## 2019-08-28 NOTE — Progress Notes (Signed)
    Sharon Burton December 08, 1950 [id number]        68 y.o.  G2P2002 for breast and pelvic exam.  Without gynecologic complaints  Past medical history,surgical history, problem list, medications, allergies, family history and social history were all reviewed and documented as reviewed in the EPIC chart.  ROS:  Performed with pertinent positives and negatives included in the history, assessment and plan.   Additional significant findings : None   Exam: Caryn Bee assistant Vitals:   08/28/19 1027  BP: 126/80  Weight: 178 lb (80.7 kg)  Height: 5\' 4"  (1.626 m)   Body mass index is 30.55 kg/m.  General appearance:  Normal affect, orientation and appearance. Skin: Grossly normal HEENT: Without gross lesions.  No cervical or supraclavicular adenopathy. Thyroid normal.  Lungs:  Clear without wheezing, rales or rhonchi Cardiac: RR, without RMG Abdominal:  Soft, nontender, without masses, guarding, rebound, organomegaly or hernia Breasts:  Examined lying and sitting without masses, retractions, discharge or axillary adenopathy. Pelvic:  Ext, BUS, Vagina: With atrophic changes  Adnexa: Without masses or tenderness    Anus and perineum: Normal   Rectovaginal: Normal sphincter tone without palpated masses or tenderness.    Assessment/Plan:  68 y.o. G53P2002 female for breast and pelvic exam.  Status post TVH BSO anterior colporrhaphy 2014  1. Postmenopausal.  No significant menopausal symptoms. 2. Mammography 05/2019.  Continue with annual mammography when due.  Breast exam normal today. 3. Colonoscopy 2017.  Repeat at their recommended interval. 4. Pap smear 2016.  No Pap smear done today.  No history of significant abnormal Pap smears.  We both agree to stop screening per current screening guidelines. 5. Osteopenia.  DEXA 2019 T score -1.2.  FRAX 8% / 0.7%.  Plan repeat DEXA in several years. 6. Insomnia.  Uses Xanax 0.5 mg as needed for sleep with good results and no side effects.  #30  with 5 refills provided. 7. Health maintenance.  No routine lab work done as patient does this elsewhere.  Follow-up 1 year, sooner as needed.   Anastasio Auerbach MD, 10:45 AM 08/28/2019

## 2019-08-28 NOTE — Patient Instructions (Signed)
Follow-up in 1 year for annual exam, sooner as needed. 

## 2019-09-30 IMAGING — MG DIGITAL SCREENING BILATERAL MAMMOGRAM WITH TOMO AND CAD
8 series · 9 of 24 positions shown · non-contrast
Comparison: Previous exam(s).

CLINICAL DATA: Screening.

EXAM:
DIGITAL SCREENING BILATERAL MAMMOGRAM WITH TOMO AND CAD

[L MLO synth-2D]
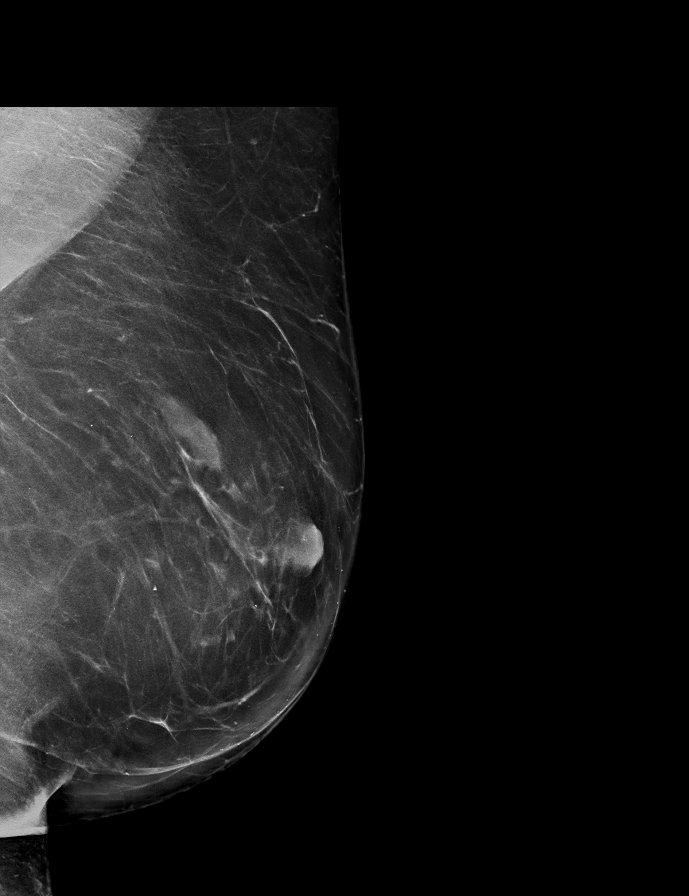

[L CC synth-2D]
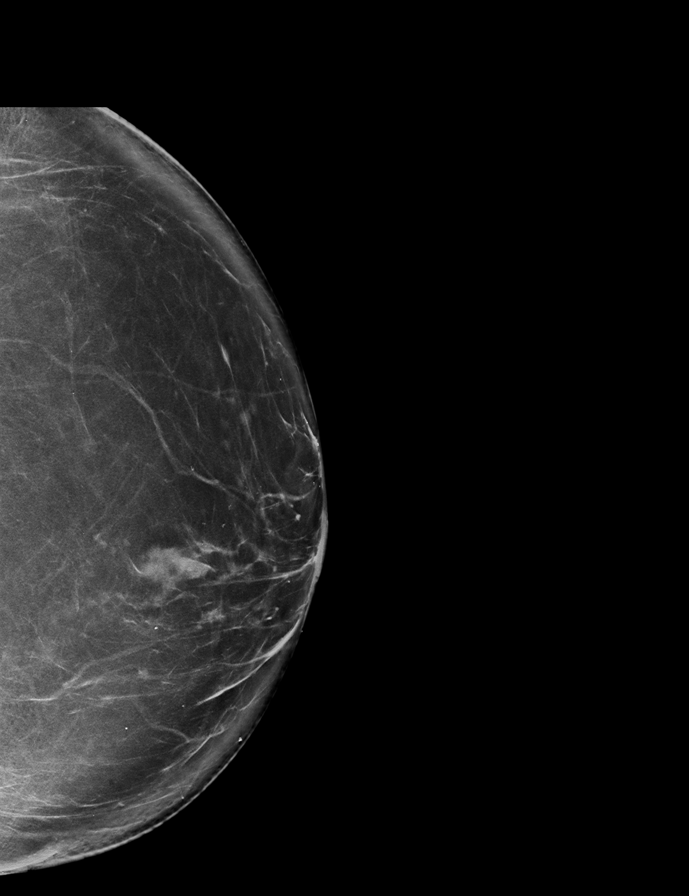

[R MLO synth-2D]
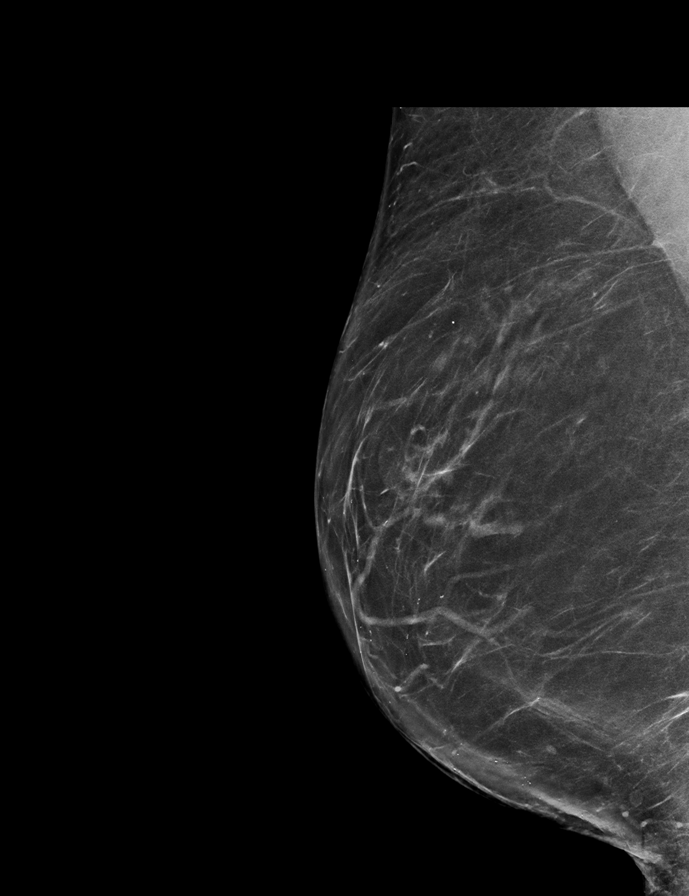

[R CC synth-2D]
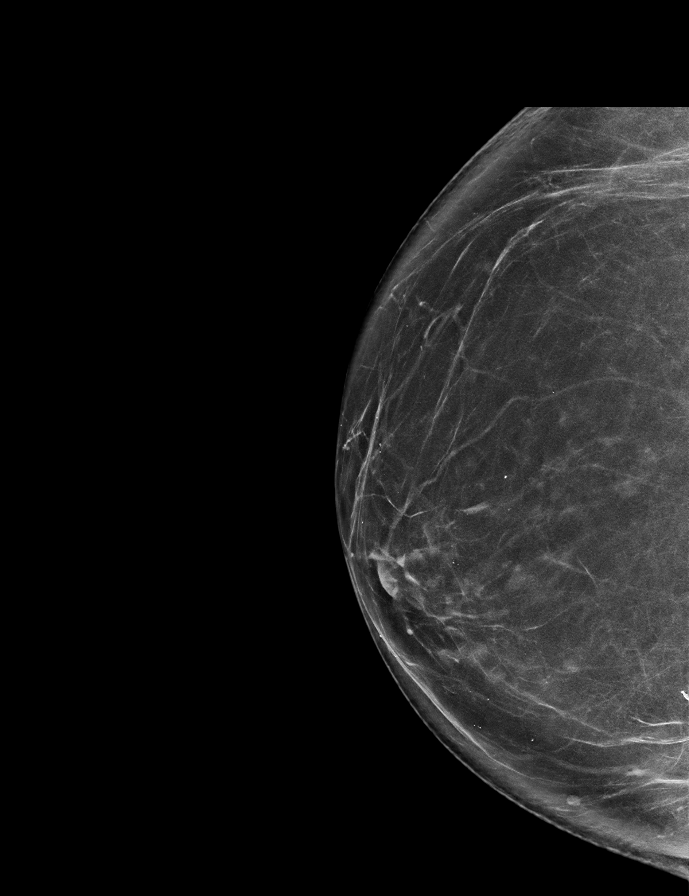

[R MLO tomo · 2 of 79 frames shown]
[frame 26/79]
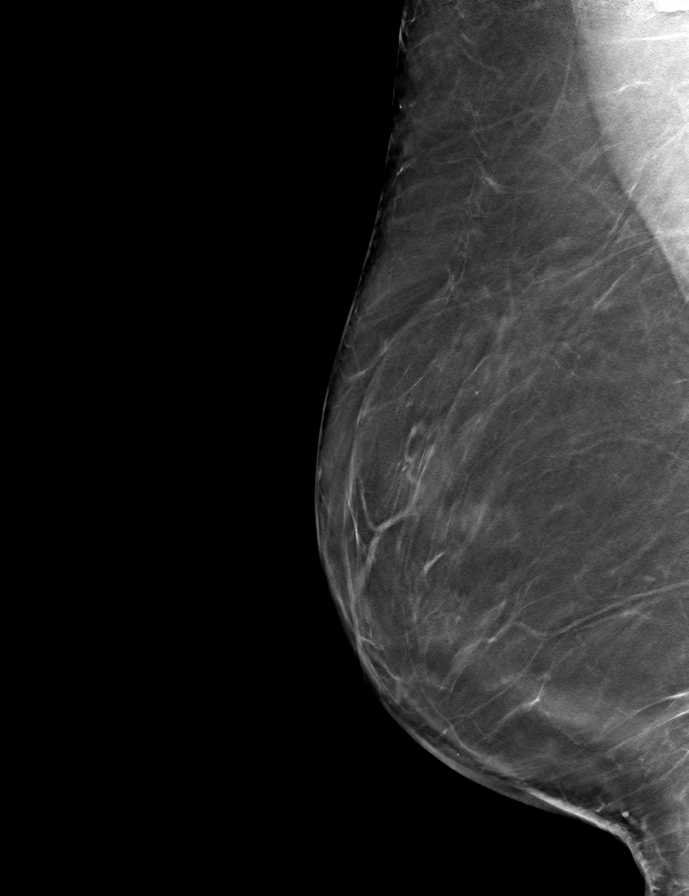
[frame 40/79]
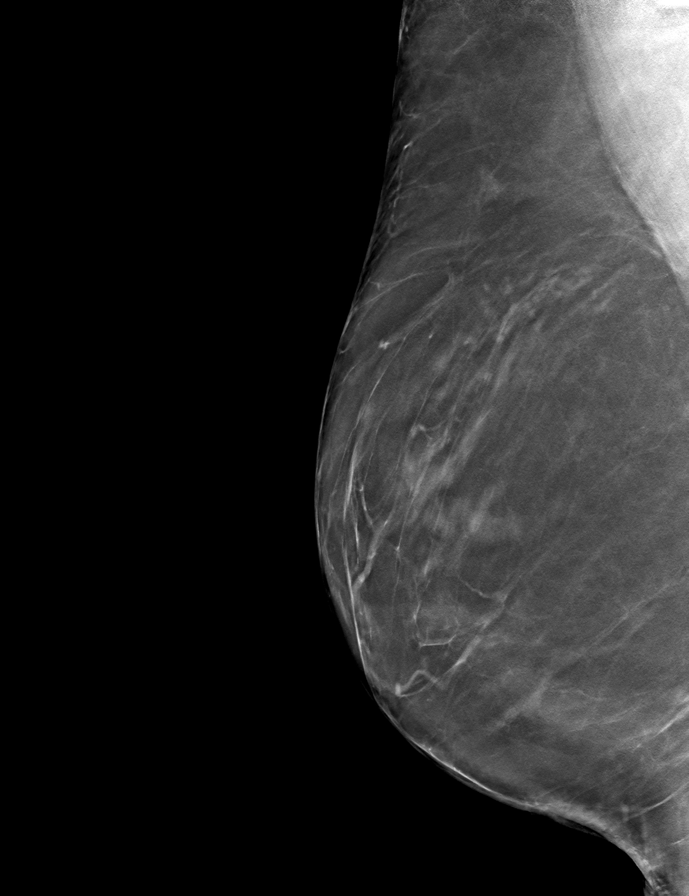

[R CC tomo · tomo slice 40/79.0]
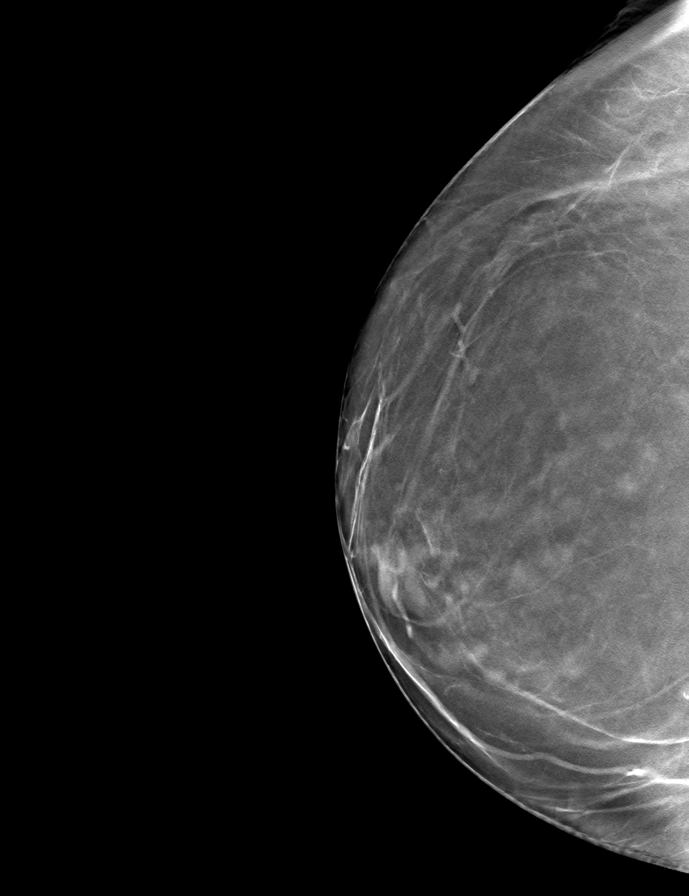

[L CC tomo · tomo slice 42/83.0]
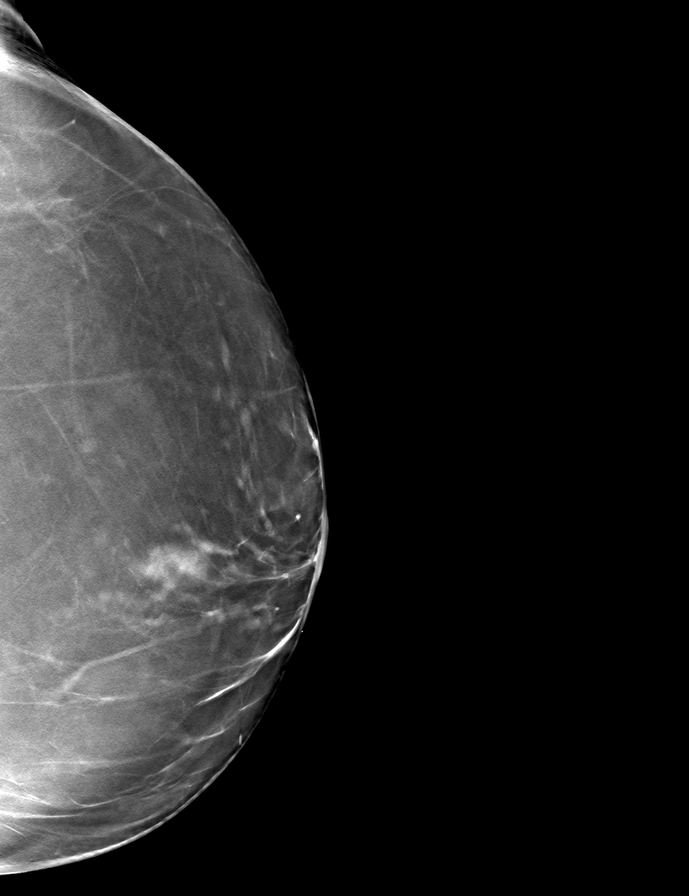

[L MLO tomo · tomo slice 45/88.0]
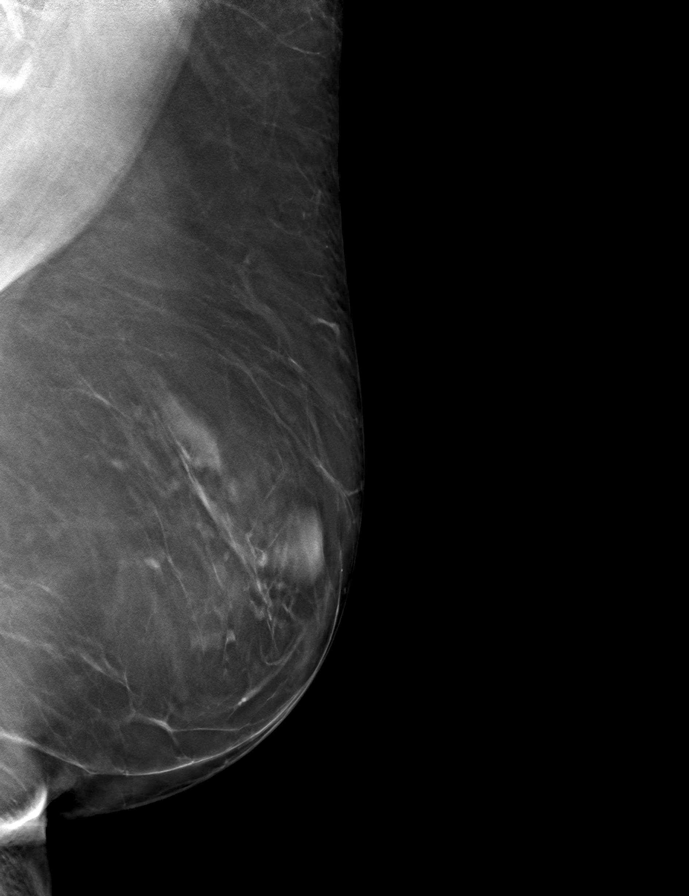

[9 of 24 positions shown; findings below may reference images not displayed]

ACR Breast Density Category b: There are scattered areas of
fibroglandular density.
FINDINGS: There are no findings suspicious for malignancy. Images were
processed with CAD.
IMPRESSION: No mammographic evidence of malignancy. A result letter of this
screening mammogram will be mailed directly to the patient.

RECOMMENDATION:
Screening mammogram in one year. (Code:[id number])

BI-RADS CATEGORY  1: Negative.

## 2019-12-28 DIAGNOSIS — E7800 Pure hypercholesterolemia, unspecified: Secondary | ICD-10-CM | POA: Diagnosis not present

## 2019-12-28 DIAGNOSIS — I10 Essential (primary) hypertension: Secondary | ICD-10-CM | POA: Diagnosis not present

## 2019-12-28 DIAGNOSIS — R739 Hyperglycemia, unspecified: Secondary | ICD-10-CM | POA: Diagnosis not present

## 2019-12-28 DIAGNOSIS — E663 Overweight: Secondary | ICD-10-CM | POA: Diagnosis not present

## 2019-12-28 DIAGNOSIS — Z780 Asymptomatic menopausal state: Secondary | ICD-10-CM | POA: Diagnosis not present

## 2019-12-28 DIAGNOSIS — Z0000 Encounter for general adult medical examination without abnormal findings: Secondary | ICD-10-CM | POA: Diagnosis not present

## 2020-03-28 DIAGNOSIS — Z780 Asymptomatic menopausal state: Secondary | ICD-10-CM | POA: Diagnosis not present

## 2020-03-28 DIAGNOSIS — I10 Essential (primary) hypertension: Secondary | ICD-10-CM | POA: Diagnosis not present

## 2020-03-28 DIAGNOSIS — E663 Overweight: Secondary | ICD-10-CM | POA: Diagnosis not present

## 2020-04-30 ENCOUNTER — Other Ambulatory Visit: Payer: Self-pay

## 2020-04-30 NOTE — Telephone Encounter (Signed)
Former Dr. Loetta Rough patient. At 09/08/20 visit he wrote: "Insomnia.  Uses Xanax 0.5 mg as needed for sleep with good results and no side effects.  #30 with 5 refills provided."  I noticed she does have PCP, Dr. Yaakov Guthrie, and just had physical with her on 12/28/19.

## 2020-05-01 NOTE — Telephone Encounter (Signed)
Patient called inquiring about this refill. Advised to her I will call her or send in when I receive reply.  I did give her a head up that Dr. Delilah Shan does not usually prescribe this medication.

## 2020-05-05 NOTE — Telephone Encounter (Signed)
Addendum to not below. I did not actually speak with patient but left a detailed message in her voice mail per [facility] access note on file.

## 2020-05-05 NOTE — Telephone Encounter (Signed)
Spoke with patient and informed her. °

## 2020-05-05 NOTE — Telephone Encounter (Signed)
Would suggest asking her PCP.

## 2020-05-23 DIAGNOSIS — Z23 Encounter for immunization: Secondary | ICD-10-CM | POA: Diagnosis not present

## 2020-05-23 DIAGNOSIS — G4700 Insomnia, unspecified: Secondary | ICD-10-CM | POA: Diagnosis not present

## 2020-05-23 DIAGNOSIS — F4322 Adjustment disorder with anxiety: Secondary | ICD-10-CM | POA: Diagnosis not present

## 2020-07-02 ENCOUNTER — Other Ambulatory Visit: Payer: Self-pay | Admitting: Obstetrics and Gynecology

## 2020-07-02 DIAGNOSIS — Z1231 Encounter for screening mammogram for malignant neoplasm of breast: Secondary | ICD-10-CM

## 2020-07-29 ENCOUNTER — Ambulatory Visit: Payer: PPO

## 2020-08-07 DIAGNOSIS — K5901 Slow transit constipation: Secondary | ICD-10-CM | POA: Diagnosis not present

## 2020-08-07 DIAGNOSIS — K921 Melena: Secondary | ICD-10-CM | POA: Diagnosis not present

## 2020-08-07 DIAGNOSIS — R195 Other fecal abnormalities: Secondary | ICD-10-CM | POA: Diagnosis not present

## 2020-08-08 DIAGNOSIS — R195 Other fecal abnormalities: Secondary | ICD-10-CM | POA: Diagnosis not present

## 2020-08-11 ENCOUNTER — Other Ambulatory Visit: Payer: Self-pay

## 2020-08-11 ENCOUNTER — Ambulatory Visit
Admission: RE | Admit: 2020-08-11 | Discharge: 2020-08-11 | Disposition: A | Discharge status: Home / Self Care | Payer: PPO | Source: Ambulatory Visit | Attending: Obstetrics and Gynecology | Admitting: Obstetrics and Gynecology

## 2020-08-11 DIAGNOSIS — Z1231 Encounter for screening mammogram for malignant neoplasm of breast: Secondary | ICD-10-CM | POA: Diagnosis not present

## 2020-09-01 DIAGNOSIS — K625 Hemorrhage of anus and rectum: Secondary | ICD-10-CM | POA: Diagnosis not present

## 2020-09-01 DIAGNOSIS — K5900 Constipation, unspecified: Secondary | ICD-10-CM | POA: Diagnosis not present

## 2020-09-02 ENCOUNTER — Encounter: Payer: PPO | Admitting: Obstetrics and Gynecology

## 2020-10-22 DIAGNOSIS — Z01812 Encounter for preprocedural laboratory examination: Secondary | ICD-10-CM | POA: Diagnosis not present

## 2020-10-27 DIAGNOSIS — D175 Benign lipomatous neoplasm of intra-abdominal organs: Secondary | ICD-10-CM | POA: Diagnosis not present

## 2020-10-27 DIAGNOSIS — K625 Hemorrhage of anus and rectum: Secondary | ICD-10-CM | POA: Diagnosis not present

## 2020-10-27 DIAGNOSIS — K640 First degree hemorrhoids: Secondary | ICD-10-CM | POA: Diagnosis not present

## 2020-12-30 DIAGNOSIS — Z780 Asymptomatic menopausal state: Secondary | ICD-10-CM | POA: Diagnosis not present

## 2020-12-30 DIAGNOSIS — R739 Hyperglycemia, unspecified: Secondary | ICD-10-CM | POA: Diagnosis not present

## 2020-12-30 DIAGNOSIS — Z13220 Encounter for screening for lipoid disorders: Secondary | ICD-10-CM | POA: Diagnosis not present

## 2020-12-30 DIAGNOSIS — I10 Essential (primary) hypertension: Secondary | ICD-10-CM | POA: Diagnosis not present

## 2021-01-06 DIAGNOSIS — Z1389 Encounter for screening for other disorder: Secondary | ICD-10-CM | POA: Diagnosis not present

## 2021-01-06 DIAGNOSIS — Z0000 Encounter for general adult medical examination without abnormal findings: Secondary | ICD-10-CM | POA: Diagnosis not present

## 2021-04-29 DIAGNOSIS — M25552 Pain in left hip: Secondary | ICD-10-CM | POA: Diagnosis not present

## 2021-04-29 DIAGNOSIS — M25551 Pain in right hip: Secondary | ICD-10-CM | POA: Diagnosis not present

## 2021-07-07 DIAGNOSIS — G4700 Insomnia, unspecified: Secondary | ICD-10-CM | POA: Diagnosis not present

## 2021-07-07 DIAGNOSIS — E663 Overweight: Secondary | ICD-10-CM | POA: Diagnosis not present

## 2021-07-07 DIAGNOSIS — E7800 Pure hypercholesterolemia, unspecified: Secondary | ICD-10-CM | POA: Diagnosis not present

## 2021-07-07 DIAGNOSIS — I10 Essential (primary) hypertension: Secondary | ICD-10-CM | POA: Diagnosis not present

## 2021-07-07 DIAGNOSIS — R739 Hyperglycemia, unspecified: Secondary | ICD-10-CM | POA: Diagnosis not present

## 2021-07-07 DIAGNOSIS — Z780 Asymptomatic menopausal state: Secondary | ICD-10-CM | POA: Diagnosis not present

## 2021-07-28 DIAGNOSIS — L820 Inflamed seborrheic keratosis: Secondary | ICD-10-CM | POA: Diagnosis not present

## 2021-07-28 DIAGNOSIS — L918 Other hypertrophic disorders of the skin: Secondary | ICD-10-CM | POA: Diagnosis not present

## 2021-07-28 DIAGNOSIS — L821 Other seborrheic keratosis: Secondary | ICD-10-CM | POA: Diagnosis not present

## 2021-10-16 DIAGNOSIS — M545 Low back pain, unspecified: Secondary | ICD-10-CM | POA: Diagnosis not present

## 2021-10-16 DIAGNOSIS — M25551 Pain in right hip: Secondary | ICD-10-CM | POA: Diagnosis not present

## 2021-10-16 DIAGNOSIS — M25552 Pain in left hip: Secondary | ICD-10-CM | POA: Diagnosis not present

## 2021-11-10 DIAGNOSIS — M5416 Radiculopathy, lumbar region: Secondary | ICD-10-CM | POA: Diagnosis not present

## 2021-12-11 DIAGNOSIS — M25552 Pain in left hip: Secondary | ICD-10-CM | POA: Diagnosis not present

## 2021-12-11 DIAGNOSIS — M545 Low back pain, unspecified: Secondary | ICD-10-CM | POA: Diagnosis not present

## 2021-12-11 DIAGNOSIS — M25551 Pain in right hip: Secondary | ICD-10-CM | POA: Diagnosis not present

## 2022-01-01 DIAGNOSIS — R739 Hyperglycemia, unspecified: Secondary | ICD-10-CM | POA: Diagnosis not present

## 2022-01-01 DIAGNOSIS — F4322 Adjustment disorder with anxiety: Secondary | ICD-10-CM | POA: Diagnosis not present

## 2022-01-01 DIAGNOSIS — I10 Essential (primary) hypertension: Secondary | ICD-10-CM | POA: Diagnosis not present

## 2022-01-01 DIAGNOSIS — E7800 Pure hypercholesterolemia, unspecified: Secondary | ICD-10-CM | POA: Diagnosis not present

## 2022-01-08 DIAGNOSIS — M545 Low back pain, unspecified: Secondary | ICD-10-CM | POA: Diagnosis not present

## 2022-01-08 DIAGNOSIS — Z1389 Encounter for screening for other disorder: Secondary | ICD-10-CM | POA: Diagnosis not present

## 2022-01-08 DIAGNOSIS — Z0000 Encounter for general adult medical examination without abnormal findings: Secondary | ICD-10-CM | POA: Diagnosis not present

## 2022-01-11 ENCOUNTER — Other Ambulatory Visit: Payer: Self-pay | Admitting: Family Medicine

## 2022-01-11 DIAGNOSIS — Z1231 Encounter for screening mammogram for malignant neoplasm of breast: Secondary | ICD-10-CM

## 2022-01-21 ENCOUNTER — Ambulatory Visit: Payer: PPO

## 2022-02-10 DIAGNOSIS — R059 Cough, unspecified: Secondary | ICD-10-CM | POA: Diagnosis not present

## 2022-02-10 DIAGNOSIS — R509 Fever, unspecified: Secondary | ICD-10-CM | POA: Diagnosis not present

## 2022-02-10 DIAGNOSIS — U071 COVID-19: Secondary | ICD-10-CM | POA: Diagnosis not present

## 2022-02-10 DIAGNOSIS — J3489 Other specified disorders of nose and nasal sinuses: Secondary | ICD-10-CM | POA: Diagnosis not present

## 2022-02-22 DIAGNOSIS — M5416 Radiculopathy, lumbar region: Secondary | ICD-10-CM | POA: Diagnosis not present

## 2022-02-22 DIAGNOSIS — M48061 Spinal stenosis, lumbar region without neurogenic claudication: Secondary | ICD-10-CM | POA: Diagnosis not present

## 2022-02-22 DIAGNOSIS — E663 Overweight: Secondary | ICD-10-CM | POA: Diagnosis not present

## 2022-04-26 DIAGNOSIS — M5416 Radiculopathy, lumbar region: Secondary | ICD-10-CM | POA: Diagnosis not present

## 2022-04-26 DIAGNOSIS — M5137 Other intervertebral disc degeneration, lumbosacral region: Secondary | ICD-10-CM | POA: Diagnosis not present

## 2022-04-26 DIAGNOSIS — E663 Overweight: Secondary | ICD-10-CM | POA: Diagnosis not present

## 2022-04-26 DIAGNOSIS — M47816 Spondylosis without myelopathy or radiculopathy, lumbar region: Secondary | ICD-10-CM | POA: Diagnosis not present

## 2022-04-26 DIAGNOSIS — M5136 Other intervertebral disc degeneration, lumbar region: Secondary | ICD-10-CM | POA: Diagnosis not present

## 2022-04-26 DIAGNOSIS — M48061 Spinal stenosis, lumbar region without neurogenic claudication: Secondary | ICD-10-CM | POA: Diagnosis not present

## 2022-04-26 DIAGNOSIS — M47817 Spondylosis without myelopathy or radiculopathy, lumbosacral region: Secondary | ICD-10-CM | POA: Diagnosis not present

## 2022-05-19 DIAGNOSIS — R29898 Other symptoms and signs involving the musculoskeletal system: Secondary | ICD-10-CM | POA: Diagnosis not present

## 2022-05-19 DIAGNOSIS — M48061 Spinal stenosis, lumbar region without neurogenic claudication: Secondary | ICD-10-CM | POA: Diagnosis not present

## 2022-05-19 DIAGNOSIS — R2689 Other abnormalities of gait and mobility: Secondary | ICD-10-CM | POA: Diagnosis not present

## 2022-05-19 DIAGNOSIS — M6281 Muscle weakness (generalized): Secondary | ICD-10-CM | POA: Diagnosis not present

## 2022-05-19 DIAGNOSIS — M5416 Radiculopathy, lumbar region: Secondary | ICD-10-CM | POA: Diagnosis not present

## 2022-06-07 DIAGNOSIS — Z723 Lack of physical exercise: Secondary | ICD-10-CM | POA: Diagnosis not present

## 2022-06-07 DIAGNOSIS — M545 Low back pain, unspecified: Secondary | ICD-10-CM | POA: Diagnosis not present

## 2022-06-07 DIAGNOSIS — R6889 Other general symptoms and signs: Secondary | ICD-10-CM | POA: Diagnosis not present

## 2022-06-07 DIAGNOSIS — R262 Difficulty in walking, not elsewhere classified: Secondary | ICD-10-CM | POA: Diagnosis not present

## 2022-06-07 DIAGNOSIS — R2689 Other abnormalities of gait and mobility: Secondary | ICD-10-CM | POA: Diagnosis not present

## 2022-06-07 DIAGNOSIS — M48061 Spinal stenosis, lumbar region without neurogenic claudication: Secondary | ICD-10-CM | POA: Diagnosis not present

## 2022-06-07 DIAGNOSIS — R293 Abnormal posture: Secondary | ICD-10-CM | POA: Diagnosis not present

## 2022-06-07 DIAGNOSIS — M5416 Radiculopathy, lumbar region: Secondary | ICD-10-CM | POA: Diagnosis not present

## 2022-06-09 DIAGNOSIS — M48061 Spinal stenosis, lumbar region without neurogenic claudication: Secondary | ICD-10-CM | POA: Diagnosis not present

## 2022-06-09 DIAGNOSIS — R6889 Other general symptoms and signs: Secondary | ICD-10-CM | POA: Diagnosis not present

## 2022-06-09 DIAGNOSIS — R2689 Other abnormalities of gait and mobility: Secondary | ICD-10-CM | POA: Diagnosis not present

## 2022-06-09 DIAGNOSIS — Z723 Lack of physical exercise: Secondary | ICD-10-CM | POA: Diagnosis not present

## 2022-06-09 DIAGNOSIS — M5416 Radiculopathy, lumbar region: Secondary | ICD-10-CM | POA: Diagnosis not present

## 2022-06-09 DIAGNOSIS — R262 Difficulty in walking, not elsewhere classified: Secondary | ICD-10-CM | POA: Diagnosis not present

## 2022-06-09 DIAGNOSIS — M545 Low back pain, unspecified: Secondary | ICD-10-CM | POA: Diagnosis not present

## 2022-06-09 DIAGNOSIS — R293 Abnormal posture: Secondary | ICD-10-CM | POA: Diagnosis not present

## 2022-06-14 DIAGNOSIS — R262 Difficulty in walking, not elsewhere classified: Secondary | ICD-10-CM | POA: Diagnosis not present

## 2022-06-14 DIAGNOSIS — Z723 Lack of physical exercise: Secondary | ICD-10-CM | POA: Diagnosis not present

## 2022-06-14 DIAGNOSIS — M48061 Spinal stenosis, lumbar region without neurogenic claudication: Secondary | ICD-10-CM | POA: Diagnosis not present

## 2022-06-14 DIAGNOSIS — R6889 Other general symptoms and signs: Secondary | ICD-10-CM | POA: Diagnosis not present

## 2022-06-14 DIAGNOSIS — R2689 Other abnormalities of gait and mobility: Secondary | ICD-10-CM | POA: Diagnosis not present

## 2022-06-14 DIAGNOSIS — M545 Low back pain, unspecified: Secondary | ICD-10-CM | POA: Diagnosis not present

## 2022-06-14 DIAGNOSIS — M5416 Radiculopathy, lumbar region: Secondary | ICD-10-CM | POA: Diagnosis not present

## 2022-06-14 DIAGNOSIS — R293 Abnormal posture: Secondary | ICD-10-CM | POA: Diagnosis not present

## 2022-06-16 DIAGNOSIS — M5416 Radiculopathy, lumbar region: Secondary | ICD-10-CM | POA: Diagnosis not present

## 2022-06-16 DIAGNOSIS — M545 Low back pain, unspecified: Secondary | ICD-10-CM | POA: Diagnosis not present

## 2022-06-16 DIAGNOSIS — R262 Difficulty in walking, not elsewhere classified: Secondary | ICD-10-CM | POA: Diagnosis not present

## 2022-06-16 DIAGNOSIS — R293 Abnormal posture: Secondary | ICD-10-CM | POA: Diagnosis not present

## 2022-06-16 DIAGNOSIS — R6889 Other general symptoms and signs: Secondary | ICD-10-CM | POA: Diagnosis not present

## 2022-06-16 DIAGNOSIS — M48061 Spinal stenosis, lumbar region without neurogenic claudication: Secondary | ICD-10-CM | POA: Diagnosis not present

## 2022-06-16 DIAGNOSIS — R2689 Other abnormalities of gait and mobility: Secondary | ICD-10-CM | POA: Diagnosis not present

## 2022-06-16 DIAGNOSIS — Z723 Lack of physical exercise: Secondary | ICD-10-CM | POA: Diagnosis not present

## 2022-06-21 DIAGNOSIS — M48061 Spinal stenosis, lumbar region without neurogenic claudication: Secondary | ICD-10-CM | POA: Diagnosis not present

## 2022-06-21 DIAGNOSIS — R293 Abnormal posture: Secondary | ICD-10-CM | POA: Diagnosis not present

## 2022-06-21 DIAGNOSIS — R262 Difficulty in walking, not elsewhere classified: Secondary | ICD-10-CM | POA: Diagnosis not present

## 2022-06-21 DIAGNOSIS — M5416 Radiculopathy, lumbar region: Secondary | ICD-10-CM | POA: Diagnosis not present

## 2022-06-21 DIAGNOSIS — M545 Low back pain, unspecified: Secondary | ICD-10-CM | POA: Diagnosis not present

## 2022-06-21 DIAGNOSIS — R2689 Other abnormalities of gait and mobility: Secondary | ICD-10-CM | POA: Diagnosis not present

## 2022-06-21 DIAGNOSIS — R6889 Other general symptoms and signs: Secondary | ICD-10-CM | POA: Diagnosis not present

## 2022-06-21 DIAGNOSIS — Z723 Lack of physical exercise: Secondary | ICD-10-CM | POA: Diagnosis not present

## 2022-06-28 DIAGNOSIS — M48061 Spinal stenosis, lumbar region without neurogenic claudication: Secondary | ICD-10-CM | POA: Diagnosis not present

## 2022-06-28 DIAGNOSIS — E663 Overweight: Secondary | ICD-10-CM | POA: Diagnosis not present

## 2022-06-28 DIAGNOSIS — M5416 Radiculopathy, lumbar region: Secondary | ICD-10-CM | POA: Diagnosis not present

## 2022-06-29 DIAGNOSIS — Z1211 Encounter for screening for malignant neoplasm of colon: Secondary | ICD-10-CM | POA: Diagnosis not present

## 2022-06-29 DIAGNOSIS — G4700 Insomnia, unspecified: Secondary | ICD-10-CM | POA: Diagnosis not present

## 2022-06-29 DIAGNOSIS — Z23 Encounter for immunization: Secondary | ICD-10-CM | POA: Diagnosis not present

## 2022-06-29 DIAGNOSIS — I10 Essential (primary) hypertension: Secondary | ICD-10-CM | POA: Diagnosis not present

## 2022-06-29 DIAGNOSIS — Z1231 Encounter for screening mammogram for malignant neoplasm of breast: Secondary | ICD-10-CM | POA: Diagnosis not present

## 2022-06-29 DIAGNOSIS — F4322 Adjustment disorder with anxiety: Secondary | ICD-10-CM | POA: Diagnosis not present

## 2022-06-29 DIAGNOSIS — E663 Overweight: Secondary | ICD-10-CM | POA: Diagnosis not present

## 2022-07-30 ENCOUNTER — Other Ambulatory Visit: Payer: Self-pay | Admitting: Internal Medicine

## 2022-07-30 ENCOUNTER — Other Ambulatory Visit: Payer: Self-pay | Admitting: Family Medicine

## 2022-07-30 DIAGNOSIS — Z1231 Encounter for screening mammogram for malignant neoplasm of breast: Secondary | ICD-10-CM

## 2022-10-01 ENCOUNTER — Ambulatory Visit
Admission: RE | Admit: 2022-10-01 | Discharge: 2022-10-01 | Disposition: A | Discharge status: Home / Self Care | Payer: PPO | Source: Ambulatory Visit | Attending: Internal Medicine | Admitting: Internal Medicine

## 2022-10-01 DIAGNOSIS — Z1231 Encounter for screening mammogram for malignant neoplasm of breast: Secondary | ICD-10-CM | POA: Diagnosis not present

## 2023-01-04 DIAGNOSIS — M48061 Spinal stenosis, lumbar region without neurogenic claudication: Secondary | ICD-10-CM | POA: Diagnosis not present

## 2023-01-04 DIAGNOSIS — M5416 Radiculopathy, lumbar region: Secondary | ICD-10-CM | POA: Diagnosis not present

## 2023-01-04 DIAGNOSIS — Z1330 Encounter for screening examination for mental health and behavioral disorders, unspecified: Secondary | ICD-10-CM | POA: Diagnosis not present

## 2023-01-04 DIAGNOSIS — M47816 Spondylosis without myelopathy or radiculopathy, lumbar region: Secondary | ICD-10-CM | POA: Diagnosis not present

## 2023-01-10 ENCOUNTER — Ambulatory Visit
Admission: RE | Admit: 2023-01-10 | Discharge: 2023-01-10 | Disposition: A | Discharge status: Home / Self Care | Payer: PPO | Source: Ambulatory Visit | Attending: Internal Medicine | Admitting: Internal Medicine

## 2023-01-10 ENCOUNTER — Other Ambulatory Visit: Payer: Self-pay | Admitting: Internal Medicine

## 2023-01-10 DIAGNOSIS — Z0000 Encounter for general adult medical examination without abnormal findings: Secondary | ICD-10-CM | POA: Diagnosis not present

## 2023-01-10 DIAGNOSIS — R0602 Shortness of breath: Secondary | ICD-10-CM | POA: Diagnosis not present

## 2023-01-10 DIAGNOSIS — I10 Essential (primary) hypertension: Secondary | ICD-10-CM | POA: Diagnosis not present

## 2023-01-10 DIAGNOSIS — R7309 Other abnormal glucose: Secondary | ICD-10-CM | POA: Diagnosis not present

## 2023-01-10 DIAGNOSIS — F4322 Adjustment disorder with anxiety: Secondary | ICD-10-CM | POA: Diagnosis not present

## 2023-01-10 DIAGNOSIS — R0789 Other chest pain: Secondary | ICD-10-CM | POA: Diagnosis not present

## 2023-01-10 DIAGNOSIS — Z79899 Other long term (current) drug therapy: Secondary | ICD-10-CM | POA: Diagnosis not present

## 2023-01-10 DIAGNOSIS — E6609 Other obesity due to excess calories: Secondary | ICD-10-CM | POA: Diagnosis not present

## 2023-01-10 DIAGNOSIS — E7800 Pure hypercholesterolemia, unspecified: Secondary | ICD-10-CM | POA: Diagnosis not present

## 2023-01-10 DIAGNOSIS — Z6830 Body mass index (BMI) 30.0-30.9, adult: Secondary | ICD-10-CM | POA: Diagnosis not present

## 2023-01-10 DIAGNOSIS — Z13820 Encounter for screening for osteoporosis: Secondary | ICD-10-CM

## 2023-01-10 DIAGNOSIS — Z23 Encounter for immunization: Secondary | ICD-10-CM | POA: Diagnosis not present

## 2023-01-10 DIAGNOSIS — K219 Gastro-esophageal reflux disease without esophagitis: Secondary | ICD-10-CM | POA: Diagnosis not present

## 2023-01-14 DIAGNOSIS — M5416 Radiculopathy, lumbar region: Secondary | ICD-10-CM | POA: Diagnosis not present

## 2023-02-07 ENCOUNTER — Encounter: Payer: Self-pay | Admitting: Cardiology

## 2023-02-07 ENCOUNTER — Ambulatory Visit: Payer: PPO | Admitting: Cardiology

## 2023-02-07 VITALS — BP 125/75 | HR 60 | Resp 16 | Ht 64.0 in | Wt 176.2 lb

## 2023-02-07 DIAGNOSIS — I10 Essential (primary) hypertension: Secondary | ICD-10-CM

## 2023-02-07 DIAGNOSIS — R0609 Other forms of dyspnea: Secondary | ICD-10-CM

## 2023-02-07 DIAGNOSIS — R072 Precordial pain: Secondary | ICD-10-CM | POA: Diagnosis not present

## 2023-02-07 NOTE — Progress Notes (Signed)
Primary Physician/Referring:  Emilio Aspen, MD  Patient ID: Sharon Burton, female    DOB: December 16, 1950, 72 y.o.   MRN: [id number]  Chief Complaint  Patient presents with   Chest Pain        Shortness of Breath   New Patient (Initial Visit)    Referred by Eleanora Neighbor, MD   HPI:    Sharon Burton  is a 72 y.o. with history of hypertension, recently treated with PPI for GERD, referred to me for evaluation of chest pain and dyspnea on exertion.  Patient recently had complete physical examination, mentioned she has been having occasional episodes of chest tightness sometimes radiating to the back, she has also noticed mild dyspnea, chest pain described as tightness to sometimes pressure-like sensation in the middle of the chest and also the left side of the chest.  She has noticed some tingling and numbness in her left arm.  She is also noticed mild dyspnea occasionally.  She still continues to exercise regularly at the [facility].  She has not exercised in the last 2 weeks.  She was having frequent episodes of GERD, was taking Tums previously, now has started taking Nexium recently and has noticed improvement in symptoms.  Past Medical History:  Diagnosis Date   Hypertension    Osteopenia 05/2018   T score -1.2 FRAX 8% / 0.7%   Shingles    Past Surgical History:  Procedure Laterality Date   BREAST SURGERY  1999   BREAST REDUCTION...   CYSTOCELE REPAIR N/A 01/01/2013   Procedure: ANTERIOR REPAIR (CYSTOCELE);  Surgeon: Dara Lords, MD;  Location: [facility];  Service: Gynecology;  Laterality: N/A;   REDUCTION MAMMAPLASTY Bilateral    TONSILLECTOMY AND ADENOIDECTOMY     VAGINAL HYSTERECTOMY Bilateral 01/01/2013   Procedure: HYSTERECTOMY VAGINAL, BILATERAL SALPINGO-OOPHORECTOMY;  Surgeon: Dara Lords, MD  For prolapse   Family History  Problem Relation Age of Onset   Diabetes Mother    Hypertension Mother    Heart disease Father    Hypertension Father     Alcoholism Father    Hypertension Sister    Heart disease Sister    Cancer Sister        MELANOMA   Hypertension Brother    Cancer Brother        MELANOMA    Social History   Tobacco Use   Smoking status: Former   Smokeless tobacco: Never  Substance Use Topics   Alcohol use: Yes    Alcohol/week: 0.0 standard drinks of alcohol    Comment: OCC   Marital Status: Married  ROS  Review of Systems  Cardiovascular:  Positive for chest pain and dyspnea on exertion. Negative for leg swelling.   Objective      02/07/2023    9:03 AM 08/28/2019   10:27 AM 05/18/2018    9:51 AM  Vitals with BMI  Height 5\' 4"  5\' 4"  5' 4.5"  Weight 176 lbs 3 oz 178 lbs 178 lbs  BMI 30.23 30.54 30.09  Systolic 125 126 409  Diastolic 75 80 74  Pulse 60     SpO2: 98 %  Physical Exam Neck:     Vascular: No carotid bruit or JVD.  Cardiovascular:     Rate and Rhythm: Normal rate and regular rhythm.     Pulses: Intact distal pulses.     Heart sounds: Normal heart sounds. No murmur heard.    No gallop.  Pulmonary:     Effort: Pulmonary  effort is normal.     Breath sounds: Normal breath sounds.  Abdominal:     General: Bowel sounds are normal.     Palpations: Abdomen is soft.  Musculoskeletal:     Right lower leg: No edema.     Left lower leg: No edema.     Laboratory examination:   External labs:   Labs 01/10/2023:  Serum glucose 92 mg, BUN 17, creatinine 0.91, EGFR 75, potassium 4.3, LFTs normal.  Hb 12.6/HCT 38.4, platelets 206 6, normal indicis.  A1c 5.6%.  TSH normal at 2.44.  Total cholesterol 210, triglycerides 125, HDL 75, LDL 115.  Non-HDL cholesterol 137.  Radiology:   Chest x-ray two-view 01/10/2023: The cardiomediastinal contours are normal. The lungs are clear. Pulmonary vasculature is normal. No consolidation, pleural effusion, or pneumothorax. No pulmonary mass or visualized nodule. No acute osseous abnormalities are seen.  Cardiac Studies:   NA  EKG:   EKG  02/07/2023: Sinus bradycardia at rate of 56 bpm otherwise normal EKG.    Medications and allergies  No Known Allergies   Medication list   Current Outpatient Medications:    esomeprazole (NEXIUM) 20 MG capsule, 20 mg., Disp: , Rfl:    ibuprofen (ADVIL,MOTRIN) 200 MG tablet, Take 600-800 mg by mouth daily as needed for headache., Disp: , Rfl:    lisinopril (PRINIVIL,ZESTRIL) 5 MG tablet, Take 5 mg by mouth daily., Disp: , Rfl:    Multiple Vitamin (MULTIVITAMIN) tablet, Take 1 tablet by mouth daily., Disp: , Rfl:    traZODone (DESYREL) 50 MG tablet, Take 50 mg by mouth at bedtime., Disp: , Rfl:    Vitamin D, Cholecalciferol, 10 MCG (400 UNIT) CAPS, Vitamin D (Cholecalciferol), Disp: , Rfl:   Assessment     ICD-10-CM   1. Precordial pain  R07.2 PCV ECHOCARDIOGRAM COMPLETE    PCV CARDIAC STRESS TEST    2. Dyspnea on exertion  R06.09 EKG 12-Lead    PCV ECHOCARDIOGRAM COMPLETE    PCV CARDIAC STRESS TEST    3. Primary hypertension  I10        Orders Placed This Encounter  Procedures   PCV CARDIAC STRESS TEST    Standing Status:   Future    Standing Expiration Date:   04/09/2023   EKG 12-Lead   PCV ECHOCARDIOGRAM COMPLETE    Standing Status:   Future    Standing Expiration Date:   02/07/2024    No orders of the defined types were placed in this encounter.   Medications Discontinued During This Encounter  Medication Reason   calcium carbonate (TUMS - DOSED IN MG ELEMENTAL CALCIUM) 500 MG chewable tablet    cycloSPORINE (RESTASIS) 0.05 % ophthalmic emulsion    ALPRAZolam (XANAX) 0.5 MG tablet      Recommendations:   Sharon Burton is a 72 y.o. Caucasian female patient with hypertension, GERD, otherwise no significant prior cardiovascular history referred to me for evaluation of chest pain and also dyspnea on exertion.  1. Precordial pain Patient chest pain or hard to kind of delineate although since being on a PPI symptoms have improved.  Hence chest pain may suggest GERD  however in view of her age, sometimes chest pain with radiation to the back, she is also noticed mild exertional dyspnea, I have recommended a simple treadmill stress test and also routine echocardiogram.  - PCV ECHOCARDIOGRAM COMPLETE; Future - PCV CARDIAC STRESS TEST; Future  2. Dyspnea on exertion Physical examination is unremarkable, EKG is normal.  I reviewed her  chest x-ray that was recently performed, essentially normal chest x-ray as well.  Will evaluate her dyspnea by routine treadmill stress test and an echocardiogram. - EKG 12-Lead - PCV ECHOCARDIOGRAM COMPLETE; Future - PCV CARDIAC STRESS TEST; Future  3. Primary hypertension Patient is presently on ACE inhibitor with excellent control of blood pressure.  I also reviewed external labs, non-HDL cholesterol is very close to normal, in the absence of high risk factors like diabetes, smoking, known vascular disease, no indication for starting her on a statin.  I will personally perform the test and if I find abnormalities,  will perform further evaluation. Otherwise unless new on ongoing symptoms(patient advised to contact us), preventive  therapy is recommended. I will then see the patient on a PRN basis.     Yates Decamp, MD, [facility] 02/07/2023, 9:51 AM Office: [phone]

## 2023-02-15 DIAGNOSIS — M48061 Spinal stenosis, lumbar region without neurogenic claudication: Secondary | ICD-10-CM | POA: Diagnosis not present

## 2023-02-15 DIAGNOSIS — M5416 Radiculopathy, lumbar region: Secondary | ICD-10-CM | POA: Diagnosis not present

## 2023-02-16 ENCOUNTER — Ambulatory Visit: Payer: PPO

## 2023-02-16 DIAGNOSIS — R0609 Other forms of dyspnea: Secondary | ICD-10-CM | POA: Diagnosis not present

## 2023-02-16 DIAGNOSIS — R072 Precordial pain: Secondary | ICD-10-CM

## 2023-02-20 NOTE — Progress Notes (Signed)
Echocardiogram 02/15/2023: Left ventricle cavity is normal in size and ventricular wall thickness. Normal global wall motion. Normal LV systolic function with EF 68%. Normal diastolic filling pattern. Mild (Grade I) mitral regurgitation. No evidence of pulmonary hypertension. No obvious abnormality on resting echocardiogram to explain dyspnea.

## 2023-02-25 ENCOUNTER — Ambulatory Visit: Payer: PPO

## 2023-02-25 DIAGNOSIS — R072 Precordial pain: Secondary | ICD-10-CM

## 2023-02-25 DIAGNOSIS — R0609 Other forms of dyspnea: Secondary | ICD-10-CM | POA: Diagnosis not present

## 2023-04-12 ENCOUNTER — Other Ambulatory Visit: Payer: PPO

## 2023-05-09 DIAGNOSIS — M5416 Radiculopathy, lumbar region: Secondary | ICD-10-CM | POA: Diagnosis not present

## 2023-06-23 DIAGNOSIS — M5416 Radiculopathy, lumbar region: Secondary | ICD-10-CM | POA: Diagnosis not present

## 2023-06-23 DIAGNOSIS — M47816 Spondylosis without myelopathy or radiculopathy, lumbar region: Secondary | ICD-10-CM | POA: Diagnosis not present

## 2023-06-23 DIAGNOSIS — M48061 Spinal stenosis, lumbar region without neurogenic claudication: Secondary | ICD-10-CM | POA: Diagnosis not present

## 2023-06-23 DIAGNOSIS — M5412 Radiculopathy, cervical region: Secondary | ICD-10-CM | POA: Diagnosis not present

## 2023-06-30 DIAGNOSIS — M4722 Other spondylosis with radiculopathy, cervical region: Secondary | ICD-10-CM | POA: Diagnosis not present

## 2023-06-30 DIAGNOSIS — M50122 Cervical disc disorder at C5-C6 level with radiculopathy: Secondary | ICD-10-CM | POA: Diagnosis not present

## 2023-06-30 DIAGNOSIS — M50123 Cervical disc disorder at C6-C7 level with radiculopathy: Secondary | ICD-10-CM | POA: Diagnosis not present

## 2023-06-30 DIAGNOSIS — M50121 Cervical disc disorder at C4-C5 level with radiculopathy: Secondary | ICD-10-CM | POA: Diagnosis not present

## 2023-06-30 DIAGNOSIS — M5412 Radiculopathy, cervical region: Secondary | ICD-10-CM | POA: Diagnosis not present

## 2023-07-06 DIAGNOSIS — E663 Overweight: Secondary | ICD-10-CM | POA: Diagnosis not present

## 2023-07-06 DIAGNOSIS — M5416 Radiculopathy, lumbar region: Secondary | ICD-10-CM | POA: Diagnosis not present

## 2023-07-06 DIAGNOSIS — M4722 Other spondylosis with radiculopathy, cervical region: Secondary | ICD-10-CM | POA: Diagnosis not present

## 2023-07-06 DIAGNOSIS — M48061 Spinal stenosis, lumbar region without neurogenic claudication: Secondary | ICD-10-CM | POA: Diagnosis not present

## 2023-07-12 DIAGNOSIS — M4722 Other spondylosis with radiculopathy, cervical region: Secondary | ICD-10-CM | POA: Diagnosis not present

## 2023-07-14 DIAGNOSIS — M4722 Other spondylosis with radiculopathy, cervical region: Secondary | ICD-10-CM | POA: Diagnosis not present

## 2023-08-03 ENCOUNTER — Ambulatory Visit
Admission: RE | Admit: 2023-08-03 | Discharge: 2023-08-03 | Disposition: A | Discharge status: Home / Self Care | Payer: PPO | Source: Ambulatory Visit | Attending: Internal Medicine | Admitting: Internal Medicine

## 2023-08-03 DIAGNOSIS — Z13820 Encounter for screening for osteoporosis: Secondary | ICD-10-CM

## 2023-08-03 DIAGNOSIS — M8588 Other specified disorders of bone density and structure, other site: Secondary | ICD-10-CM | POA: Diagnosis not present

## 2023-08-03 DIAGNOSIS — N958 Other specified menopausal and perimenopausal disorders: Secondary | ICD-10-CM | POA: Diagnosis not present

## 2023-08-03 DIAGNOSIS — Z90722 Acquired absence of ovaries, bilateral: Secondary | ICD-10-CM | POA: Diagnosis not present

## 2023-08-04 DIAGNOSIS — K219 Gastro-esophageal reflux disease without esophagitis: Secondary | ICD-10-CM | POA: Diagnosis not present

## 2023-08-04 DIAGNOSIS — Z6829 Body mass index (BMI) 29.0-29.9, adult: Secondary | ICD-10-CM | POA: Diagnosis not present

## 2023-08-04 DIAGNOSIS — M8589 Other specified disorders of bone density and structure, multiple sites: Secondary | ICD-10-CM | POA: Diagnosis not present

## 2023-08-04 DIAGNOSIS — M4722 Other spondylosis with radiculopathy, cervical region: Secondary | ICD-10-CM | POA: Diagnosis not present

## 2023-08-04 DIAGNOSIS — Z01812 Encounter for preprocedural laboratory examination: Secondary | ICD-10-CM | POA: Diagnosis not present

## 2023-08-04 DIAGNOSIS — Z79899 Other long term (current) drug therapy: Secondary | ICD-10-CM | POA: Diagnosis not present

## 2023-08-04 DIAGNOSIS — Z87891 Personal history of nicotine dependence: Secondary | ICD-10-CM | POA: Diagnosis not present

## 2023-08-04 DIAGNOSIS — I10 Essential (primary) hypertension: Secondary | ICD-10-CM | POA: Diagnosis not present

## 2023-08-04 DIAGNOSIS — M48061 Spinal stenosis, lumbar region without neurogenic claudication: Secondary | ICD-10-CM | POA: Diagnosis not present

## 2023-08-04 DIAGNOSIS — E669 Obesity, unspecified: Secondary | ICD-10-CM | POA: Diagnosis not present

## 2023-08-11 DIAGNOSIS — Z90710 Acquired absence of both cervix and uterus: Secondary | ICD-10-CM | POA: Diagnosis not present

## 2023-08-11 DIAGNOSIS — Z91048 Other nonmedicinal substance allergy status: Secondary | ICD-10-CM | POA: Diagnosis not present

## 2023-08-11 DIAGNOSIS — Z6829 Body mass index (BMI) 29.0-29.9, adult: Secondary | ICD-10-CM | POA: Diagnosis not present

## 2023-08-11 DIAGNOSIS — E669 Obesity, unspecified: Secondary | ICD-10-CM | POA: Diagnosis not present

## 2023-08-11 DIAGNOSIS — Z981 Arthrodesis status: Secondary | ICD-10-CM | POA: Diagnosis not present

## 2023-08-11 DIAGNOSIS — Z87891 Personal history of nicotine dependence: Secondary | ICD-10-CM | POA: Diagnosis not present

## 2023-08-11 DIAGNOSIS — M4807 Spinal stenosis, lumbosacral region: Secondary | ICD-10-CM | POA: Diagnosis not present

## 2023-08-11 DIAGNOSIS — M4802 Spinal stenosis, cervical region: Secondary | ICD-10-CM | POA: Diagnosis not present

## 2023-08-11 DIAGNOSIS — M48061 Spinal stenosis, lumbar region without neurogenic claudication: Secondary | ICD-10-CM | POA: Diagnosis not present

## 2023-08-11 DIAGNOSIS — Z79899 Other long term (current) drug therapy: Secondary | ICD-10-CM | POA: Diagnosis not present

## 2023-08-11 DIAGNOSIS — M5416 Radiculopathy, lumbar region: Secondary | ICD-10-CM | POA: Diagnosis not present

## 2023-08-11 DIAGNOSIS — M5010 Cervical disc disorder with radiculopathy, unspecified cervical region: Secondary | ICD-10-CM | POA: Diagnosis not present

## 2023-08-11 DIAGNOSIS — K219 Gastro-esophageal reflux disease without esophagitis: Secondary | ICD-10-CM | POA: Diagnosis not present

## 2023-08-11 DIAGNOSIS — M2578 Osteophyte, vertebrae: Secondary | ICD-10-CM | POA: Diagnosis not present

## 2023-08-11 DIAGNOSIS — M4722 Other spondylosis with radiculopathy, cervical region: Secondary | ICD-10-CM | POA: Diagnosis not present

## 2023-08-11 DIAGNOSIS — I10 Essential (primary) hypertension: Secondary | ICD-10-CM | POA: Diagnosis not present

## 2023-08-11 DIAGNOSIS — M47812 Spondylosis without myelopathy or radiculopathy, cervical region: Secondary | ICD-10-CM | POA: Diagnosis not present

## 2023-08-30 DIAGNOSIS — Z981 Arthrodesis status: Secondary | ICD-10-CM | POA: Diagnosis not present

## 2023-08-30 DIAGNOSIS — M4716 Other spondylosis with myelopathy, lumbar region: Secondary | ICD-10-CM | POA: Diagnosis not present

## 2023-12-20 DIAGNOSIS — M5416 Radiculopathy, lumbar region: Secondary | ICD-10-CM | POA: Diagnosis not present

## 2023-12-20 DIAGNOSIS — M48061 Spinal stenosis, lumbar region without neurogenic claudication: Secondary | ICD-10-CM | POA: Diagnosis not present

## 2023-12-30 DIAGNOSIS — M5416 Radiculopathy, lumbar region: Secondary | ICD-10-CM | POA: Diagnosis not present

## 2024-01-13 DIAGNOSIS — G894 Chronic pain syndrome: Secondary | ICD-10-CM | POA: Diagnosis not present

## 2024-01-13 DIAGNOSIS — M5416 Radiculopathy, lumbar region: Secondary | ICD-10-CM | POA: Diagnosis not present

## 2024-01-13 DIAGNOSIS — M48061 Spinal stenosis, lumbar region without neurogenic claudication: Secondary | ICD-10-CM | POA: Diagnosis not present

## 2024-01-16 DIAGNOSIS — Z1211 Encounter for screening for malignant neoplasm of colon: Secondary | ICD-10-CM | POA: Diagnosis not present

## 2024-01-16 DIAGNOSIS — G8929 Other chronic pain: Secondary | ICD-10-CM | POA: Diagnosis not present

## 2024-01-16 DIAGNOSIS — Z1331 Encounter for screening for depression: Secondary | ICD-10-CM | POA: Diagnosis not present

## 2024-01-16 DIAGNOSIS — E7800 Pure hypercholesterolemia, unspecified: Secondary | ICD-10-CM | POA: Diagnosis not present

## 2024-01-16 DIAGNOSIS — K219 Gastro-esophageal reflux disease without esophagitis: Secondary | ICD-10-CM | POA: Diagnosis not present

## 2024-01-16 DIAGNOSIS — Z0000 Encounter for general adult medical examination without abnormal findings: Secondary | ICD-10-CM | POA: Diagnosis not present

## 2024-01-16 DIAGNOSIS — Z79899 Other long term (current) drug therapy: Secondary | ICD-10-CM | POA: Diagnosis not present

## 2024-01-16 DIAGNOSIS — I10 Essential (primary) hypertension: Secondary | ICD-10-CM | POA: Diagnosis not present

## 2024-01-16 DIAGNOSIS — R7303 Prediabetes: Secondary | ICD-10-CM | POA: Diagnosis not present

## 2024-01-16 DIAGNOSIS — M8580 Other specified disorders of bone density and structure, unspecified site: Secondary | ICD-10-CM | POA: Diagnosis not present

## 2024-01-16 DIAGNOSIS — M545 Low back pain, unspecified: Secondary | ICD-10-CM | POA: Diagnosis not present

## 2024-01-16 DIAGNOSIS — G4700 Insomnia, unspecified: Secondary | ICD-10-CM | POA: Diagnosis not present

## 2024-01-19 ENCOUNTER — Other Ambulatory Visit (HOSPITAL_COMMUNITY): Payer: Self-pay | Admitting: Internal Medicine

## 2024-01-19 DIAGNOSIS — E7800 Pure hypercholesterolemia, unspecified: Secondary | ICD-10-CM

## 2024-01-25 ENCOUNTER — Ambulatory Visit (HOSPITAL_COMMUNITY)
Admission: RE | Admit: 2024-01-25 | Discharge: 2024-01-25 | Disposition: A | Discharge status: Home / Self Care | Payer: Self-pay | Source: Ambulatory Visit | Attending: Internal Medicine | Admitting: Internal Medicine

## 2024-01-25 DIAGNOSIS — E7800 Pure hypercholesterolemia, unspecified: Secondary | ICD-10-CM | POA: Insufficient documentation

## 2024-01-31 DIAGNOSIS — M47816 Spondylosis without myelopathy or radiculopathy, lumbar region: Secondary | ICD-10-CM | POA: Diagnosis not present

## 2024-01-31 DIAGNOSIS — G894 Chronic pain syndrome: Secondary | ICD-10-CM | POA: Diagnosis not present

## 2024-03-05 DIAGNOSIS — M47816 Spondylosis without myelopathy or radiculopathy, lumbar region: Secondary | ICD-10-CM | POA: Diagnosis not present

## 2024-03-20 DIAGNOSIS — M85851 Other specified disorders of bone density and structure, right thigh: Secondary | ICD-10-CM | POA: Diagnosis not present

## 2024-03-20 DIAGNOSIS — Z23 Encounter for immunization: Secondary | ICD-10-CM | POA: Diagnosis not present

## 2024-03-20 DIAGNOSIS — M51369 Other intervertebral disc degeneration, lumbar region without mention of lumbar back pain or lower extremity pain: Secondary | ICD-10-CM | POA: Diagnosis not present

## 2024-03-20 DIAGNOSIS — G4700 Insomnia, unspecified: Secondary | ICD-10-CM | POA: Diagnosis not present

## 2024-03-20 DIAGNOSIS — I10 Essential (primary) hypertension: Secondary | ICD-10-CM | POA: Diagnosis not present

## 2024-03-20 DIAGNOSIS — R7303 Prediabetes: Secondary | ICD-10-CM | POA: Diagnosis not present

## 2024-03-20 DIAGNOSIS — E7800 Pure hypercholesterolemia, unspecified: Secondary | ICD-10-CM | POA: Diagnosis not present

## 2024-03-21 DIAGNOSIS — M47816 Spondylosis without myelopathy or radiculopathy, lumbar region: Secondary | ICD-10-CM | POA: Diagnosis not present

## 2024-03-30 DIAGNOSIS — M47816 Spondylosis without myelopathy or radiculopathy, lumbar region: Secondary | ICD-10-CM | POA: Diagnosis not present

## 2024-05-08 DIAGNOSIS — Z4789 Encounter for other orthopedic aftercare: Secondary | ICD-10-CM | POA: Diagnosis not present

## 2024-05-08 DIAGNOSIS — Z981 Arthrodesis status: Secondary | ICD-10-CM | POA: Diagnosis not present

## 2024-08-29 DIAGNOSIS — M47816 Spondylosis without myelopathy or radiculopathy, lumbar region: Secondary | ICD-10-CM | POA: Diagnosis not present

## 2024-08-29 DIAGNOSIS — G894 Chronic pain syndrome: Secondary | ICD-10-CM | POA: Diagnosis not present
